# Patient Record
Sex: Female | Born: 1965 | Race: Black or African American | Hispanic: No | Marital: Single | Smoking: Never smoker
Health system: Southern US, Community
[De-identification: ages and names within clinical notes are randomized; demographics above are authoritative.]

## PROBLEM LIST (undated history)

## (undated) DIAGNOSIS — M1990 Unspecified osteoarthritis, unspecified site: Secondary | ICD-10-CM

## (undated) DIAGNOSIS — G8929 Other chronic pain: Secondary | ICD-10-CM

## (undated) DIAGNOSIS — I10 Essential (primary) hypertension: Secondary | ICD-10-CM

## (undated) DIAGNOSIS — C801 Malignant (primary) neoplasm, unspecified: Secondary | ICD-10-CM

## (undated) DIAGNOSIS — M79606 Pain in leg, unspecified: Secondary | ICD-10-CM

## (undated) HISTORY — DX: Unspecified osteoarthritis, unspecified site: M19.90

## (undated) HISTORY — PX: LYMPH NODE BIOPSY: SHX201

## (undated) HISTORY — PX: BREAST SURGERY: SHX581

## (undated) HISTORY — DX: Malignant (primary) neoplasm, unspecified: C80.1

---

## 1999-11-07 ENCOUNTER — Emergency Department (HOSPITAL_COMMUNITY): Admission: EM | Admit: 1999-11-07 | Discharge: 1999-11-07 | Payer: Self-pay | Admitting: Emergency Medicine

## 2000-03-11 ENCOUNTER — Inpatient Hospital Stay (HOSPITAL_COMMUNITY): Admission: AD | Admit: 2000-03-11 | Discharge: 2000-03-11 | Payer: Self-pay | Admitting: Obstetrics

## 2000-05-26 ENCOUNTER — Ambulatory Visit (HOSPITAL_COMMUNITY): Admission: RE | Admit: 2000-05-26 | Discharge: 2000-05-26 | Payer: Self-pay | Admitting: *Deleted

## 2000-05-26 ENCOUNTER — Encounter: Payer: Self-pay | Admitting: *Deleted

## 2000-09-22 ENCOUNTER — Inpatient Hospital Stay (HOSPITAL_COMMUNITY): Admission: AD | Admit: 2000-09-22 | Discharge: 2000-09-22 | Payer: Self-pay | Admitting: *Deleted

## 2000-09-23 ENCOUNTER — Inpatient Hospital Stay (HOSPITAL_COMMUNITY): Admission: AD | Admit: 2000-09-23 | Discharge: 2000-09-23 | Payer: Self-pay | Admitting: *Deleted

## 2000-10-21 ENCOUNTER — Encounter (HOSPITAL_COMMUNITY): Admission: RE | Admit: 2000-10-21 | Discharge: 2000-10-27 | Payer: Self-pay | Admitting: *Deleted

## 2000-10-25 ENCOUNTER — Inpatient Hospital Stay (HOSPITAL_COMMUNITY): Admission: AD | Admit: 2000-10-25 | Discharge: 2000-10-25 | Payer: Self-pay | Admitting: *Deleted

## 2000-10-26 ENCOUNTER — Encounter (INDEPENDENT_AMBULATORY_CARE_PROVIDER_SITE_OTHER): Payer: Self-pay | Admitting: Specialist

## 2000-10-26 ENCOUNTER — Inpatient Hospital Stay (HOSPITAL_COMMUNITY): Admission: AD | Admit: 2000-10-26 | Discharge: 2000-10-29 | Payer: Self-pay | Admitting: *Deleted

## 2001-02-25 ENCOUNTER — Emergency Department (HOSPITAL_COMMUNITY): Admission: EM | Admit: 2001-02-25 | Discharge: 2001-02-25 | Payer: Self-pay | Admitting: Emergency Medicine

## 2001-03-01 ENCOUNTER — Emergency Department (HOSPITAL_COMMUNITY): Admission: EM | Admit: 2001-03-01 | Discharge: 2001-03-01 | Payer: Self-pay

## 2001-04-08 ENCOUNTER — Emergency Department (HOSPITAL_COMMUNITY): Admission: EM | Admit: 2001-04-08 | Discharge: 2001-04-08 | Payer: Self-pay | Admitting: Emergency Medicine

## 2003-05-20 ENCOUNTER — Emergency Department (HOSPITAL_COMMUNITY): Admission: EM | Admit: 2003-05-20 | Discharge: 2003-05-20 | Payer: Self-pay | Admitting: Emergency Medicine

## 2003-09-22 ENCOUNTER — Emergency Department (HOSPITAL_COMMUNITY): Admission: AD | Admit: 2003-09-22 | Discharge: 2003-09-22 | Payer: Self-pay | Admitting: Family Medicine

## 2004-06-02 ENCOUNTER — Encounter: Admission: RE | Admit: 2004-06-02 | Discharge: 2004-06-02 | Payer: Self-pay | Admitting: Occupational Medicine

## 2004-07-15 ENCOUNTER — Emergency Department (HOSPITAL_COMMUNITY): Admission: EM | Admit: 2004-07-15 | Discharge: 2004-07-15 | Payer: Self-pay | Admitting: Emergency Medicine

## 2005-02-20 ENCOUNTER — Emergency Department (HOSPITAL_COMMUNITY): Admission: EM | Admit: 2005-02-20 | Discharge: 2005-02-20 | Payer: Self-pay | Admitting: Emergency Medicine

## 2005-03-01 ENCOUNTER — Ambulatory Visit: Payer: Self-pay | Admitting: *Deleted

## 2005-03-01 ENCOUNTER — Ambulatory Visit: Payer: Self-pay | Admitting: Family Medicine

## 2005-03-16 ENCOUNTER — Ambulatory Visit: Payer: Self-pay | Admitting: Family Medicine

## 2005-07-12 ENCOUNTER — Ambulatory Visit: Payer: Self-pay | Admitting: Family Medicine

## 2005-11-10 ENCOUNTER — Ambulatory Visit: Payer: Self-pay | Admitting: Family Medicine

## 2006-02-03 ENCOUNTER — Ambulatory Visit: Payer: Self-pay | Admitting: Family Medicine

## 2006-02-04 ENCOUNTER — Ambulatory Visit (HOSPITAL_COMMUNITY): Admission: RE | Admit: 2006-02-04 | Discharge: 2006-02-04 | Payer: Self-pay | Admitting: Family Medicine

## 2006-04-08 ENCOUNTER — Ambulatory Visit: Payer: Self-pay | Admitting: Family Medicine

## 2006-05-03 ENCOUNTER — Ambulatory Visit: Payer: Self-pay | Admitting: Family Medicine

## 2007-02-04 DIAGNOSIS — E785 Hyperlipidemia, unspecified: Secondary | ICD-10-CM

## 2007-02-04 DIAGNOSIS — K219 Gastro-esophageal reflux disease without esophagitis: Secondary | ICD-10-CM

## 2007-02-04 DIAGNOSIS — E669 Obesity, unspecified: Secondary | ICD-10-CM

## 2007-02-04 DIAGNOSIS — M1990 Unspecified osteoarthritis, unspecified site: Secondary | ICD-10-CM | POA: Insufficient documentation

## 2007-02-04 DIAGNOSIS — I10 Essential (primary) hypertension: Secondary | ICD-10-CM

## 2007-04-07 ENCOUNTER — Ambulatory Visit: Payer: Self-pay | Admitting: Internal Medicine

## 2007-04-08 LAB — CONVERTED CEMR LAB
AST: 18 units/L
BUN: 17 mg/dL
Calcium: 9.7 mg/dL
HCT: 36.7 %
HDL: 54 mg/dL
LDL Cholesterol: 81 mg/dL
MCV: 83 fL
Platelets: 427 10*3/uL
Potassium: 4.3 meq/L
Sodium: 143 meq/L
Triglycerides: 60 mg/dL

## 2007-04-12 ENCOUNTER — Encounter (INDEPENDENT_AMBULATORY_CARE_PROVIDER_SITE_OTHER): Payer: Self-pay | Admitting: Family Medicine

## 2007-04-12 LAB — CONVERTED CEMR LAB
Iron: 27 ug/dL — ABNORMAL LOW (ref 42–145)
Saturation Ratios: 9 % — ABNORMAL LOW (ref 20–55)
Vitamin B-12: 396 pg/mL (ref 211–911)

## 2007-06-07 ENCOUNTER — Encounter (INDEPENDENT_AMBULATORY_CARE_PROVIDER_SITE_OTHER): Payer: Self-pay | Admitting: *Deleted

## 2007-06-20 ENCOUNTER — Telehealth (INDEPENDENT_AMBULATORY_CARE_PROVIDER_SITE_OTHER): Payer: Self-pay | Admitting: *Deleted

## 2007-06-22 ENCOUNTER — Ambulatory Visit: Payer: Self-pay | Admitting: Nurse Practitioner

## 2007-06-22 DIAGNOSIS — J309 Allergic rhinitis, unspecified: Secondary | ICD-10-CM | POA: Insufficient documentation

## 2007-06-22 DIAGNOSIS — D649 Anemia, unspecified: Secondary | ICD-10-CM | POA: Insufficient documentation

## 2007-06-22 LAB — CONVERTED CEMR LAB: LDL Goal: 160 mg/dL

## 2007-08-22 ENCOUNTER — Ambulatory Visit: Payer: Self-pay | Admitting: Family Medicine

## 2007-08-23 ENCOUNTER — Telehealth (INDEPENDENT_AMBULATORY_CARE_PROVIDER_SITE_OTHER): Payer: Self-pay | Admitting: Family Medicine

## 2007-08-23 ENCOUNTER — Ambulatory Visit (HOSPITAL_COMMUNITY): Admission: RE | Admit: 2007-08-23 | Discharge: 2007-08-23 | Payer: Self-pay | Admitting: Family Medicine

## 2007-08-23 ENCOUNTER — Ambulatory Visit: Payer: Self-pay | Admitting: Hematology & Oncology

## 2007-08-23 LAB — CONVERTED CEMR LAB
Albumin: 3.7 g/dL (ref 3.5–5.2)
BUN: 10 mg/dL (ref 6–23)
Basophils Absolute: 0 10*3/uL (ref 0.0–0.1)
Basophils Relative: 0 % (ref 0–1)
CO2: 23 meq/L (ref 19–32)
Calcium: 9.4 mg/dL (ref 8.4–10.5)
Creatinine, Ser: 0.67 mg/dL (ref 0.40–1.20)
Eosinophils Absolute: 0.3 10*3/uL (ref 0.2–0.7)
Eosinophils Relative: 2 % (ref 0–5)
Glucose, Bld: 101 mg/dL — ABNORMAL HIGH (ref 70–99)
Lymphs Abs: 1.3 10*3/uL (ref 0.7–4.0)
MCHC: 32.3 g/dL (ref 30.0–36.0)
Neutro Abs: 9.1 10*3/uL — ABNORMAL HIGH (ref 1.7–7.7)
Platelets: 498 10*3/uL — ABNORMAL HIGH (ref 150–400)
RBC: 4.21 M/uL (ref 3.87–5.11)
RDW: 15.5 % (ref 11.5–15.5)
Sodium: 141 meq/L (ref 135–145)
TSH: 0.978 microintl units/mL (ref 0.350–5.50)
Total Bilirubin: 0.3 mg/dL (ref 0.3–1.2)

## 2007-08-30 ENCOUNTER — Ambulatory Visit (HOSPITAL_COMMUNITY): Admission: RE | Admit: 2007-08-30 | Discharge: 2007-08-30 | Payer: Self-pay | Admitting: Family Medicine

## 2007-08-30 ENCOUNTER — Encounter (INDEPENDENT_AMBULATORY_CARE_PROVIDER_SITE_OTHER): Payer: Self-pay | Admitting: Family Medicine

## 2007-08-30 LAB — CBC WITH DIFFERENTIAL/PLATELET
Basophils Absolute: 0 10*3/uL (ref 0.0–0.1)
Eosinophils Absolute: 0.2 10*3/uL (ref 0.0–0.5)
HCT: 33.5 % — ABNORMAL LOW (ref 34.8–46.6)
HGB: 11.2 g/dL — ABNORMAL LOW (ref 11.6–15.9)
MCV: 81.3 fL (ref 81.0–101.0)
MONO%: 6.6 % (ref 0.0–13.0)
NEUT#: 6.6 10*3/uL — ABNORMAL HIGH (ref 1.5–6.5)
NEUT%: 76.4 % (ref 39.6–76.8)
RDW: 11.8 % (ref 11.3–14.5)

## 2007-09-01 LAB — COMPREHENSIVE METABOLIC PANEL
ALT: 10 U/L (ref 0–35)
Alkaline Phosphatase: 152 U/L — ABNORMAL HIGH (ref 39–117)
CO2: 23 mEq/L (ref 19–32)
Creatinine, Ser: 0.67 mg/dL (ref 0.40–1.20)
Total Bilirubin: 0.3 mg/dL (ref 0.3–1.2)

## 2007-09-01 LAB — PROTEIN ELECTROPHORESIS, SERUM
Alpha-1-Globulin: 7.8 % — ABNORMAL HIGH (ref 2.9–4.9)
Alpha-2-Globulin: 16.2 % — ABNORMAL HIGH (ref 7.1–11.8)
Gamma Globulin: 16 % (ref 11.1–18.8)
Total Protein, Serum Electrophoresis: 7.1 g/dL (ref 6.0–8.3)

## 2007-09-01 LAB — PROTHROMBIN TIME
INR: 1.2 (ref 0.0–1.5)
Prothrombin Time: 15.8 seconds — ABNORMAL HIGH (ref 11.6–15.2)

## 2007-09-01 LAB — LACTATE DEHYDROGENASE: LDH: 263 U/L — ABNORMAL HIGH (ref 94–250)

## 2007-09-18 ENCOUNTER — Ambulatory Visit (HOSPITAL_COMMUNITY): Admission: RE | Admit: 2007-09-18 | Discharge: 2007-09-18 | Payer: Self-pay | Admitting: General Surgery

## 2007-09-18 ENCOUNTER — Encounter (HOSPITAL_BASED_OUTPATIENT_CLINIC_OR_DEPARTMENT_OTHER): Payer: Self-pay | Admitting: General Surgery

## 2007-09-18 DIAGNOSIS — C8118 Nodular sclerosis classical Hodgkin lymphoma, lymph nodes of multiple sites: Secondary | ICD-10-CM | POA: Insufficient documentation

## 2007-09-27 ENCOUNTER — Encounter (INDEPENDENT_AMBULATORY_CARE_PROVIDER_SITE_OTHER): Payer: Self-pay | Admitting: Nurse Practitioner

## 2007-09-27 ENCOUNTER — Encounter (INDEPENDENT_AMBULATORY_CARE_PROVIDER_SITE_OTHER): Payer: Self-pay | Admitting: Family Medicine

## 2007-09-28 ENCOUNTER — Encounter (INDEPENDENT_AMBULATORY_CARE_PROVIDER_SITE_OTHER): Payer: Self-pay | Admitting: Nurse Practitioner

## 2007-09-29 ENCOUNTER — Ambulatory Visit (HOSPITAL_COMMUNITY): Admission: RE | Admit: 2007-09-29 | Discharge: 2007-09-29 | Payer: Self-pay | Admitting: Hematology & Oncology

## 2007-10-02 ENCOUNTER — Ambulatory Visit: Admission: RE | Admit: 2007-10-02 | Discharge: 2007-10-02 | Payer: Self-pay | Admitting: Hematology & Oncology

## 2007-10-03 ENCOUNTER — Ambulatory Visit: Payer: Self-pay | Admitting: Psychiatry

## 2007-10-04 ENCOUNTER — Ambulatory Visit (HOSPITAL_COMMUNITY): Admission: RE | Admit: 2007-10-04 | Discharge: 2007-10-04 | Payer: Self-pay | Admitting: Hematology & Oncology

## 2007-10-04 ENCOUNTER — Encounter: Payer: Self-pay | Admitting: Hematology & Oncology

## 2007-10-04 ENCOUNTER — Ambulatory Visit: Payer: Self-pay | Admitting: Hematology & Oncology

## 2007-10-16 ENCOUNTER — Ambulatory Visit: Payer: Self-pay | Admitting: Hematology & Oncology

## 2007-10-18 ENCOUNTER — Encounter (INDEPENDENT_AMBULATORY_CARE_PROVIDER_SITE_OTHER): Payer: Self-pay | Admitting: Family Medicine

## 2007-10-18 LAB — COMPREHENSIVE METABOLIC PANEL
ALT: 9 U/L (ref 0–35)
BUN: 15 mg/dL (ref 6–23)
CO2: 24 mEq/L (ref 19–32)
Calcium: 8.9 mg/dL (ref 8.4–10.5)
Chloride: 103 mEq/L (ref 96–112)
Creatinine, Ser: 0.69 mg/dL (ref 0.40–1.20)
Total Bilirubin: 0.3 mg/dL (ref 0.3–1.2)

## 2007-10-18 LAB — CBC WITH DIFFERENTIAL/PLATELET
BASO%: 1.2 % (ref 0.0–2.0)
Basophils Absolute: 0.1 10*3/uL (ref 0.0–0.1)
HCT: 34.9 % (ref 34.8–46.6)
HGB: 12.1 g/dL (ref 11.6–15.9)
LYMPH%: 25.3 % (ref 14.0–48.0)
MCH: 27.5 pg (ref 26.0–34.0)
MCHC: 34.6 g/dL (ref 32.0–36.0)
MONO#: 0.2 10*3/uL (ref 0.1–0.9)
NEUT%: 65.6 % (ref 39.6–76.8)
Platelets: 291 10*3/uL (ref 145–400)

## 2007-10-30 ENCOUNTER — Encounter (INDEPENDENT_AMBULATORY_CARE_PROVIDER_SITE_OTHER): Payer: Self-pay | Admitting: Family Medicine

## 2007-10-30 LAB — COMPREHENSIVE METABOLIC PANEL
ALT: 8 U/L (ref 0–35)
CO2: 23 mEq/L (ref 19–32)
Chloride: 107 mEq/L (ref 96–112)
Sodium: 142 mEq/L (ref 135–145)
Total Bilirubin: 0.2 mg/dL — ABNORMAL LOW (ref 0.3–1.2)
Total Protein: 6.9 g/dL (ref 6.0–8.3)

## 2007-10-30 LAB — CBC WITH DIFFERENTIAL/PLATELET
BASO%: 1.4 % (ref 0.0–2.0)
EOS%: 2.2 % (ref 0.0–7.0)
LYMPH%: 15.6 % (ref 14.0–48.0)
MCHC: 33.3 g/dL (ref 32.0–36.0)
MONO#: 1.5 10*3/uL — ABNORMAL HIGH (ref 0.1–0.9)
MONO%: 6.5 % (ref 0.0–13.0)
Platelets: 348 10*3/uL (ref 145–400)
RBC: 4.12 10*6/uL (ref 3.70–5.32)
WBC: 23.8 10*3/uL — ABNORMAL HIGH (ref 3.9–10.0)

## 2007-11-13 ENCOUNTER — Encounter (INDEPENDENT_AMBULATORY_CARE_PROVIDER_SITE_OTHER): Payer: Self-pay | Admitting: Family Medicine

## 2007-11-13 LAB — COMPREHENSIVE METABOLIC PANEL
Albumin: 3.8 g/dL (ref 3.5–5.2)
Alkaline Phosphatase: 114 U/L (ref 39–117)
BUN: 18 mg/dL (ref 6–23)
Creatinine, Ser: 0.78 mg/dL (ref 0.40–1.20)
Glucose, Bld: 87 mg/dL (ref 70–99)
Total Bilirubin: 0.2 mg/dL — ABNORMAL LOW (ref 0.3–1.2)

## 2007-11-13 LAB — CBC WITH DIFFERENTIAL/PLATELET
Basophils Absolute: 0.1 10*3/uL (ref 0.0–0.1)
EOS%: 3.5 % (ref 0.0–7.0)
Eosinophils Absolute: 0.5 10*3/uL (ref 0.0–0.5)
HCT: 34.4 % — ABNORMAL LOW (ref 34.8–46.6)
LYMPH%: 21 % (ref 14.0–48.0)
MCHC: 34.3 g/dL (ref 32.0–36.0)
MONO#: 1.1 10*3/uL — ABNORMAL HIGH (ref 0.1–0.9)
MONO%: 7.1 % (ref 0.0–13.0)
NEUT%: 67.4 % (ref 39.6–76.8)
RBC: 4.2 10*6/uL (ref 3.70–5.32)
RDW: 16.4 % — ABNORMAL HIGH (ref 11.3–14.5)
WBC: 14.8 10*3/uL — ABNORMAL HIGH (ref 3.9–10.0)
lymph#: 3.1 10*3/uL (ref 0.9–3.3)

## 2007-11-17 ENCOUNTER — Encounter (INDEPENDENT_AMBULATORY_CARE_PROVIDER_SITE_OTHER): Payer: Self-pay | Admitting: Family Medicine

## 2007-11-21 ENCOUNTER — Ambulatory Visit (HOSPITAL_COMMUNITY): Admission: RE | Admit: 2007-11-21 | Discharge: 2007-11-21 | Payer: Self-pay | Admitting: Hematology & Oncology

## 2007-11-27 ENCOUNTER — Encounter (INDEPENDENT_AMBULATORY_CARE_PROVIDER_SITE_OTHER): Payer: Self-pay | Admitting: Family Medicine

## 2007-11-27 LAB — CBC WITH DIFFERENTIAL/PLATELET
BASO%: 0.9 % (ref 0.0–2.0)
HCT: 31.3 % — ABNORMAL LOW (ref 34.8–46.6)
MCHC: 35.8 g/dL (ref 32.0–36.0)
MONO#: 0.8 10*3/uL (ref 0.1–0.9)
NEUT#: 8.2 10*3/uL — ABNORMAL HIGH (ref 1.5–6.5)
NEUT%: 65.7 % (ref 39.6–76.8)
RBC: 3.83 10*6/uL (ref 3.70–5.32)
WBC: 12.5 10*3/uL — ABNORMAL HIGH (ref 3.9–10.0)
lymph#: 2.6 10*3/uL (ref 0.9–3.3)

## 2007-11-27 LAB — COMPREHENSIVE METABOLIC PANEL
ALT: <8 U/L (ref 0–35)
AST: 10 U/L (ref 0–37)
Albumin: 3.8 g/dL (ref 3.5–5.2)
CO2: 22 mEq/L (ref 19–32)
Calcium: 8.9 mg/dL (ref 8.4–10.5)
Chloride: 110 mEq/L (ref 96–112)
Potassium: 4 mEq/L (ref 3.5–5.3)
Total Protein: 6.2 g/dL (ref 6.0–8.3)

## 2007-12-07 ENCOUNTER — Ambulatory Visit: Payer: Self-pay | Admitting: Hematology & Oncology

## 2007-12-11 ENCOUNTER — Encounter (INDEPENDENT_AMBULATORY_CARE_PROVIDER_SITE_OTHER): Payer: Self-pay | Admitting: Family Medicine

## 2007-12-11 LAB — COMPREHENSIVE METABOLIC PANEL
AST: 8 U/L (ref 0–37)
Albumin: 4 g/dL (ref 3.5–5.2)
Alkaline Phosphatase: 98 U/L (ref 39–117)
BUN: 16 mg/dL (ref 6–23)
Potassium: 4 mEq/L (ref 3.5–5.3)

## 2007-12-11 LAB — CBC WITH DIFFERENTIAL/PLATELET
BASO%: 1 % (ref 0.0–2.0)
Basophils Absolute: 0.1 10*3/uL (ref 0.0–0.1)
EOS%: 7.3 % — ABNORMAL HIGH (ref 0.0–7.0)
HGB: 11.5 g/dL — ABNORMAL LOW (ref 11.6–15.9)
MCH: 27.9 pg (ref 26.0–34.0)
MCHC: 33.5 g/dL (ref 32.0–36.0)
MCV: 83.4 fL (ref 81.0–101.0)
MONO%: 6.5 % (ref 0.0–13.0)
RBC: 4.11 10*6/uL (ref 3.70–5.32)
RDW: 17.9 % — ABNORMAL HIGH (ref 11.3–14.5)

## 2007-12-25 ENCOUNTER — Encounter (INDEPENDENT_AMBULATORY_CARE_PROVIDER_SITE_OTHER): Payer: Self-pay | Admitting: Family Medicine

## 2007-12-25 LAB — CBC WITH DIFFERENTIAL/PLATELET
Basophils Absolute: 0.1 10*3/uL (ref 0.0–0.1)
Eosinophils Absolute: 0.8 10*3/uL — ABNORMAL HIGH (ref 0.0–0.5)
HGB: 11.2 g/dL — ABNORMAL LOW (ref 11.6–15.9)
MONO#: 0.7 10*3/uL (ref 0.1–0.9)
NEUT#: 4.7 10*3/uL (ref 1.5–6.5)
RBC: 3.98 10*6/uL (ref 3.70–5.32)
RDW: 17.8 % — ABNORMAL HIGH (ref 11.3–14.5)
WBC: 8.3 10*3/uL (ref 3.9–10.0)

## 2008-01-08 ENCOUNTER — Encounter (INDEPENDENT_AMBULATORY_CARE_PROVIDER_SITE_OTHER): Payer: Self-pay | Admitting: Family Medicine

## 2008-01-08 LAB — COMPREHENSIVE METABOLIC PANEL
Albumin: 3.8 g/dL (ref 3.5–5.2)
Alkaline Phosphatase: 93 U/L (ref 39–117)
BUN: 15 mg/dL (ref 6–23)
CO2: 21 mEq/L (ref 19–32)
Calcium: 8.8 mg/dL (ref 8.4–10.5)
Chloride: 110 mEq/L (ref 96–112)
Glucose, Bld: 98 mg/dL (ref 70–99)
Potassium: 3.7 mEq/L (ref 3.5–5.3)
Sodium: 142 mEq/L (ref 135–145)
Total Protein: 6.4 g/dL (ref 6.0–8.3)

## 2008-01-08 LAB — CBC WITH DIFFERENTIAL/PLATELET
BASO%: 0.9 % (ref 0.0–2.0)
LYMPH%: 14.6 % (ref 14.0–48.0)
MCHC: 33.9 g/dL (ref 32.0–36.0)
MONO#: 0.6 10*3/uL (ref 0.1–0.9)
Platelets: 351 10*3/uL (ref 145–400)
RBC: 3.98 10*6/uL (ref 3.70–5.32)
RDW: 17.5 % — ABNORMAL HIGH (ref 11.3–14.5)
WBC: 9.9 10*3/uL (ref 3.9–10.0)

## 2008-01-08 LAB — LACTATE DEHYDROGENASE: LDH: 228 U/L (ref 94–250)

## 2008-01-16 ENCOUNTER — Ambulatory Visit: Admission: RE | Admit: 2008-01-16 | Discharge: 2008-03-13 | Payer: Self-pay | Admitting: Radiation Oncology

## 2008-01-17 ENCOUNTER — Encounter (INDEPENDENT_AMBULATORY_CARE_PROVIDER_SITE_OTHER): Payer: Self-pay | Admitting: Family Medicine

## 2008-01-25 ENCOUNTER — Telehealth (INDEPENDENT_AMBULATORY_CARE_PROVIDER_SITE_OTHER): Payer: Self-pay | Admitting: Family Medicine

## 2008-01-26 ENCOUNTER — Ambulatory Visit (HOSPITAL_COMMUNITY): Admission: RE | Admit: 2008-01-26 | Discharge: 2008-01-26 | Payer: Self-pay | Admitting: Hematology & Oncology

## 2008-02-08 ENCOUNTER — Encounter (INDEPENDENT_AMBULATORY_CARE_PROVIDER_SITE_OTHER): Payer: Self-pay | Admitting: Family Medicine

## 2008-02-15 ENCOUNTER — Ambulatory Visit: Payer: Self-pay | Admitting: Hematology & Oncology

## 2008-02-19 ENCOUNTER — Encounter (INDEPENDENT_AMBULATORY_CARE_PROVIDER_SITE_OTHER): Payer: Self-pay | Admitting: Family Medicine

## 2008-02-19 LAB — CBC WITH DIFFERENTIAL/PLATELET
Basophils Absolute: 0 10*3/uL (ref 0.0–0.1)
EOS%: 19.1 % — ABNORMAL HIGH (ref 0.0–7.0)
Eosinophils Absolute: 0.8 10*3/uL — ABNORMAL HIGH (ref 0.0–0.5)
HCT: 34.3 % — ABNORMAL LOW (ref 34.8–46.6)
HGB: 11.6 g/dL (ref 11.6–15.9)
MCH: 28.2 pg (ref 26.0–34.0)
MCV: 83.5 fL (ref 81.0–101.0)
MONO%: 9.5 % (ref 0.0–13.0)
NEUT#: 2 10*3/uL (ref 1.5–6.5)
NEUT%: 49.1 % (ref 39.6–76.8)
RDW: 15.7 % — ABNORMAL HIGH (ref 11.3–14.5)

## 2008-02-19 LAB — COMPREHENSIVE METABOLIC PANEL
Albumin: 3.8 g/dL (ref 3.5–5.2)
Alkaline Phosphatase: 75 U/L (ref 39–117)
BUN: 14 mg/dL (ref 6–23)
Calcium: 9 mg/dL (ref 8.4–10.5)
Chloride: 111 mEq/L (ref 96–112)
Creatinine, Ser: 0.64 mg/dL (ref 0.40–1.20)
Glucose, Bld: 93 mg/dL (ref 70–99)
Potassium: 3.9 mEq/L (ref 3.5–5.3)

## 2008-03-27 ENCOUNTER — Ambulatory Visit: Payer: Self-pay | Admitting: Hematology & Oncology

## 2008-04-10 ENCOUNTER — Encounter (INDEPENDENT_AMBULATORY_CARE_PROVIDER_SITE_OTHER): Payer: Self-pay | Admitting: Family Medicine

## 2008-04-15 ENCOUNTER — Ambulatory Visit (HOSPITAL_COMMUNITY): Admission: RE | Admit: 2008-04-15 | Discharge: 2008-04-15 | Payer: Self-pay | Admitting: Hematology & Oncology

## 2008-04-16 ENCOUNTER — Ambulatory Visit: Payer: Self-pay | Admitting: Hematology & Oncology

## 2008-04-22 ENCOUNTER — Encounter (INDEPENDENT_AMBULATORY_CARE_PROVIDER_SITE_OTHER): Payer: Self-pay | Admitting: Nurse Practitioner

## 2008-04-22 LAB — COMPREHENSIVE METABOLIC PANEL
ALT: 10 U/L (ref 0–35)
AST: 11 U/L (ref 0–37)
Alkaline Phosphatase: 90 U/L (ref 39–117)
BUN: 17 mg/dL (ref 6–23)
Calcium: 9.2 mg/dL (ref 8.4–10.5)
Creatinine, Ser: 0.66 mg/dL (ref 0.40–1.20)
Total Bilirubin: 0.4 mg/dL (ref 0.3–1.2)

## 2008-04-22 LAB — CBC WITH DIFFERENTIAL (CANCER CENTER ONLY)
BASO%: 0.4 % (ref 0.0–2.0)
EOS%: 4.7 % (ref 0.0–7.0)
LYMPH#: 1.4 10*3/uL (ref 0.9–3.3)
MCH: 28 pg (ref 26.0–34.0)
MCHC: 34.3 g/dL (ref 32.0–36.0)
MONO%: 5.7 % (ref 0.0–13.0)
NEUT#: 3 10*3/uL (ref 1.5–6.5)
Platelets: 303 10*3/uL (ref 145–400)
RBC: 4.67 10*6/uL (ref 3.70–5.32)

## 2008-06-28 ENCOUNTER — Ambulatory Visit: Payer: Self-pay | Admitting: Hematology & Oncology

## 2008-07-16 ENCOUNTER — Ambulatory Visit (HOSPITAL_COMMUNITY): Admission: RE | Admit: 2008-07-16 | Discharge: 2008-07-16 | Payer: Self-pay | Admitting: Hematology & Oncology

## 2008-07-24 ENCOUNTER — Ambulatory Visit: Payer: Self-pay | Admitting: Hematology & Oncology

## 2008-07-25 ENCOUNTER — Encounter (INDEPENDENT_AMBULATORY_CARE_PROVIDER_SITE_OTHER): Payer: Self-pay | Admitting: Family Medicine

## 2008-07-25 LAB — CBC WITH DIFFERENTIAL (CANCER CENTER ONLY)
BASO#: 0 10*3/uL (ref 0.0–0.2)
BASO%: 0.4 % (ref 0.0–2.0)
EOS%: 5.7 % (ref 0.0–7.0)
HGB: 12.5 g/dL (ref 11.6–15.9)
MCH: 29.1 pg (ref 26.0–34.0)
MCHC: 34.6 g/dL (ref 32.0–36.0)
MONO%: 4.9 % (ref 0.0–13.0)
NEUT#: 2.1 10*3/uL (ref 1.5–6.5)
NEUT%: 52.7 % (ref 39.6–80.0)
RDW: 13.4 % (ref 10.5–14.6)

## 2008-07-25 LAB — COMPREHENSIVE METABOLIC PANEL
ALT: 10 U/L (ref 0–35)
AST: 11 U/L (ref 0–37)
Albumin: 3.8 g/dL (ref 3.5–5.2)
Alkaline Phosphatase: 83 U/L (ref 39–117)
BUN: 12 mg/dL (ref 6–23)
Calcium: 9.1 mg/dL (ref 8.4–10.5)
Chloride: 107 mEq/L (ref 96–112)
Potassium: 4.2 mEq/L (ref 3.5–5.3)
Sodium: 140 mEq/L (ref 135–145)
Total Protein: 6.4 g/dL (ref 6.0–8.3)

## 2008-08-20 ENCOUNTER — Ambulatory Visit: Payer: Self-pay | Admitting: Hematology & Oncology

## 2008-10-01 ENCOUNTER — Ambulatory Visit (HOSPITAL_COMMUNITY): Admission: RE | Admit: 2008-10-01 | Discharge: 2008-10-01 | Payer: Self-pay | Admitting: Hematology & Oncology

## 2008-10-09 ENCOUNTER — Ambulatory Visit: Payer: Self-pay | Admitting: Hematology & Oncology

## 2008-10-10 ENCOUNTER — Encounter (INDEPENDENT_AMBULATORY_CARE_PROVIDER_SITE_OTHER): Payer: Self-pay | Admitting: Family Medicine

## 2008-10-10 LAB — COMPREHENSIVE METABOLIC PANEL
AST: 8 U/L (ref 0–37)
Albumin: 3.9 g/dL (ref 3.5–5.2)
BUN: 14 mg/dL (ref 6–23)
Calcium: 9.1 mg/dL (ref 8.4–10.5)
Chloride: 109 mEq/L (ref 96–112)
Creatinine, Ser: 0.68 mg/dL (ref 0.40–1.20)
Glucose, Bld: 86 mg/dL (ref 70–99)

## 2008-11-21 ENCOUNTER — Ambulatory Visit: Payer: Self-pay | Admitting: Hematology & Oncology

## 2008-12-24 ENCOUNTER — Ambulatory Visit (HOSPITAL_COMMUNITY): Admission: RE | Admit: 2008-12-24 | Discharge: 2008-12-24 | Payer: Self-pay | Admitting: Hematology & Oncology

## 2009-01-22 ENCOUNTER — Ambulatory Visit: Payer: Self-pay | Admitting: Hematology & Oncology

## 2009-01-23 LAB — CBC WITH DIFFERENTIAL (CANCER CENTER ONLY)
BASO#: 0 10*3/uL (ref 0.0–0.2)
BASO%: 0.4 % (ref 0.0–2.0)
EOS%: 3 % (ref 0.0–7.0)
HCT: 37.1 % (ref 34.8–46.6)
HGB: 12.8 g/dL (ref 11.6–15.9)
LYMPH#: 1.3 10*3/uL (ref 0.9–3.3)
LYMPH%: 31.8 % (ref 14.0–48.0)
MCH: 29.4 pg (ref 26.0–34.0)
MCHC: 34.5 g/dL (ref 32.0–36.0)
MCV: 85 fL (ref 81–101)
NEUT%: 61 % (ref 39.6–80.0)
RDW: 12.6 % (ref 10.5–14.6)

## 2009-01-23 LAB — COMPREHENSIVE METABOLIC PANEL
ALT: 12 U/L (ref 0–35)
AST: 11 U/L (ref 0–37)
Albumin: 4 g/dL (ref 3.5–5.2)
Alkaline Phosphatase: 75 U/L (ref 39–117)
Calcium: 9.5 mg/dL (ref 8.4–10.5)
Chloride: 107 mEq/L (ref 96–112)
Creatinine, Ser: 0.65 mg/dL (ref 0.40–1.20)
Potassium: 4.9 mEq/L (ref 3.5–5.3)

## 2009-01-29 ENCOUNTER — Ambulatory Visit (HOSPITAL_COMMUNITY): Admission: RE | Admit: 2009-01-29 | Discharge: 2009-01-29 | Payer: Self-pay | Admitting: Hematology & Oncology

## 2009-03-17 IMAGING — NM NM CARDIA MUGA REST
6 series · 36 of 36 positions shown · non-contrast
Comparison: none

CLINICAL DATA: Hodgkin?s lymphoma.  Prechemotherapy.  
NUCLEAR MEDICINE CARDIAC BLOOD POOL IMAGING (MUGA):
TECHNIQUE: Cardiac multi-gated acquisition was performed at rest following intravenous injection of 0c-44m labeled red blood cells.   
Radiopharmaceutical:  23 mCi 0c-44m in-vitro labeled red blood cells.

[Series 1: mu muga · 4.34mm/px · 6 of 16 frames shown (1 of 6)]
[frame 2/16  full-range]
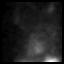
[frame 4/16  full-range]
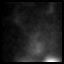
[frame 7/16  full-range]
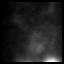
[frame 10/16  full-range]
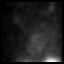
[frame 12/16  full-range]
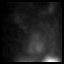
[frame 15/16  full-range]
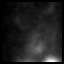

[Series 1: mu muga · 4.34mm/px · 6 of 16 frames shown (2 of 6)]
[frame 2/16  full-range]
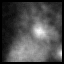
[frame 4/16  full-range]
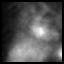
[frame 7/16  full-range]
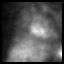
[frame 10/16  full-range]
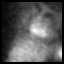
[frame 12/16  full-range]
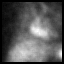
[frame 15/16  full-range]
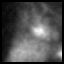

[Series 1: mu muga · 4.34mm/px · 6 of 16 frames shown (3 of 6)]
[frame 2/16  full-range]
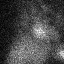
[frame 4/16  full-range]
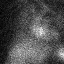
[frame 7/16  full-range]
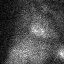
[frame 10/16  full-range]
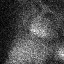
[frame 12/16  full-range]
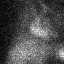
[frame 15/16  full-range]
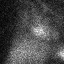

[Series 1: mu muga · 4.34mm/px · 6 of 16 frames shown (4 of 6)]
[frame 2/16  full-range]
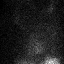
[frame 4/16  full-range]
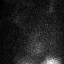
[frame 7/16  full-range]
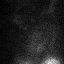
[frame 10/16  full-range]
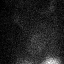
[frame 12/16  full-range]
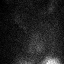
[frame 15/16  full-range]
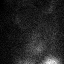

[Series 1: mu muga · 4.34mm/px · 6 of 16 frames shown (5 of 6)]
[frame 2/16  full-range]
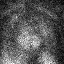
[frame 4/16  full-range]
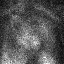
[frame 7/16  full-range]
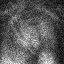
[frame 10/16  full-range]
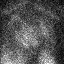
[frame 12/16  full-range]
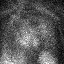
[frame 15/16  full-range]
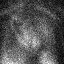

[Series 1: mu muga · 4.34mm/px · 6 of 16 frames shown (6 of 6)]
[frame 2/16  full-range]
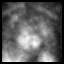
[frame 4/16  full-range]
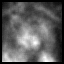
[frame 7/16  full-range]
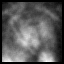
[frame 10/16  full-range]
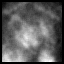
[frame 12/16  full-range]
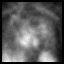
[frame 15/16  full-range]
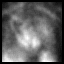

[36 of 36 positions shown; findings below may reference images not displayed]

FINDINGS: The present examination is somewhat limited with respect to evaluating cardiac motion.  What can be stated is that the calculated ejection fraction is 60%.  There is no gross left ventricular wall motion abnormality.
IMPRESSION: Slightly limited exam without gross wall motion abnormality and with left ventricular calculated ejection fraction of 60%.

## 2009-05-29 ENCOUNTER — Ambulatory Visit (HOSPITAL_COMMUNITY): Admission: RE | Admit: 2009-05-29 | Discharge: 2009-05-29 | Payer: Self-pay | Admitting: Hematology & Oncology

## 2009-06-03 ENCOUNTER — Ambulatory Visit: Payer: Self-pay | Admitting: Hematology & Oncology

## 2009-06-05 LAB — CBC WITH DIFFERENTIAL (CANCER CENTER ONLY)
BASO#: 0 10*3/uL (ref 0.0–0.2)
BASO%: 0.4 % (ref 0.0–2.0)
Eosinophils Absolute: 0.2 10*3/uL (ref 0.0–0.5)
HCT: 38.6 % (ref 34.8–46.6)
HGB: 13.2 g/dL (ref 11.6–15.9)
LYMPH#: 1.7 10*3/uL (ref 0.9–3.3)
LYMPH%: 25.9 % (ref 14.0–48.0)
MCV: 87 fL (ref 81–101)
MONO#: 0.3 10*3/uL (ref 0.1–0.9)
NEUT%: 66.2 % (ref 39.6–80.0)
RBC: 4.43 10*6/uL (ref 3.70–5.32)
RDW: 12.5 % (ref 10.5–14.6)
WBC: 6.4 10*3/uL (ref 3.9–10.0)

## 2009-06-06 LAB — COMPREHENSIVE METABOLIC PANEL
CO2: 22 mEq/L (ref 19–32)
Calcium: 9.6 mg/dL (ref 8.4–10.5)
Creatinine, Ser: 0.73 mg/dL (ref 0.40–1.20)
Glucose, Bld: 74 mg/dL (ref 70–99)
Total Bilirubin: 0.3 mg/dL (ref 0.3–1.2)

## 2009-06-06 LAB — VITAMIN D 25 HYDROXY (VIT D DEFICIENCY, FRACTURES): Vit D, 25-Hydroxy: 11 ng/mL — ABNORMAL LOW (ref 30–89)

## 2009-06-06 LAB — LACTATE DEHYDROGENASE: LDH: 130 U/L (ref 94–250)

## 2009-07-24 ENCOUNTER — Ambulatory Visit (HOSPITAL_COMMUNITY): Admission: RE | Admit: 2009-07-24 | Discharge: 2009-07-24 | Payer: Self-pay | Admitting: Hematology & Oncology

## 2009-08-06 ENCOUNTER — Encounter: Admission: RE | Admit: 2009-08-06 | Discharge: 2009-08-06 | Payer: Self-pay | Admitting: Hematology & Oncology

## 2009-08-07 ENCOUNTER — Ambulatory Visit: Payer: Self-pay | Admitting: Hematology & Oncology

## 2009-09-02 ENCOUNTER — Encounter: Admission: RE | Admit: 2009-09-02 | Discharge: 2009-09-02 | Payer: Self-pay | Admitting: Surgery

## 2009-09-04 ENCOUNTER — Encounter: Admission: RE | Admit: 2009-09-04 | Discharge: 2009-09-04 | Payer: Self-pay | Admitting: Surgery

## 2009-09-04 ENCOUNTER — Encounter (INDEPENDENT_AMBULATORY_CARE_PROVIDER_SITE_OTHER): Payer: Self-pay | Admitting: Internal Medicine

## 2009-09-04 ENCOUNTER — Ambulatory Visit (HOSPITAL_BASED_OUTPATIENT_CLINIC_OR_DEPARTMENT_OTHER): Admission: RE | Admit: 2009-09-04 | Discharge: 2009-09-04 | Payer: Self-pay | Admitting: Surgery

## 2009-09-04 ENCOUNTER — Encounter: Payer: Self-pay | Admitting: Physician Assistant

## 2009-09-05 ENCOUNTER — Telehealth: Payer: Self-pay | Admitting: Physician Assistant

## 2009-09-15 ENCOUNTER — Encounter: Payer: Self-pay | Admitting: Physician Assistant

## 2009-09-24 ENCOUNTER — Ambulatory Visit (HOSPITAL_COMMUNITY): Admission: RE | Admit: 2009-09-24 | Discharge: 2009-09-24 | Payer: Self-pay | Admitting: Hematology & Oncology

## 2009-09-30 ENCOUNTER — Ambulatory Visit: Payer: Self-pay | Admitting: Hematology & Oncology

## 2009-10-09 LAB — CBC WITH DIFFERENTIAL (CANCER CENTER ONLY)
BASO#: 0 10*3/uL (ref 0.0–0.2)
Eosinophils Absolute: 0.2 10*3/uL (ref 0.0–0.5)
HCT: 38.7 % (ref 34.8–46.6)
MCHC: 34.1 g/dL (ref 32.0–36.0)
MONO%: 2.9 % (ref 0.0–13.0)
Platelets: 389 10*3/uL (ref 145–400)
RDW: 11.9 % (ref 10.5–14.6)
WBC: 5.3 10*3/uL (ref 3.9–10.0)

## 2009-10-09 LAB — COMPREHENSIVE METABOLIC PANEL
AST: 9 U/L (ref 0–37)
Albumin: 4.3 g/dL (ref 3.5–5.2)
BUN: 18 mg/dL (ref 6–23)
Calcium: 9.9 mg/dL (ref 8.4–10.5)
Chloride: 104 mEq/L (ref 96–112)
Glucose, Bld: 99 mg/dL (ref 70–99)
Potassium: 4.4 mEq/L (ref 3.5–5.3)

## 2009-11-19 ENCOUNTER — Ambulatory Visit: Payer: Self-pay | Admitting: Internal Medicine

## 2009-11-19 LAB — CONVERTED CEMR LAB
AST: 10 units/L (ref 0–37)
Albumin: 4.2 g/dL (ref 3.5–5.2)
Alkaline Phosphatase: 73 units/L (ref 39–117)
Basophils Absolute: 0 10*3/uL (ref 0.0–0.1)
Chloride: 107 meq/L (ref 96–112)
Creatinine, Ser: 0.67 mg/dL (ref 0.40–1.20)
Eosinophils Relative: 3 % (ref 0–5)
HDL: 49 mg/dL (ref >39)
Hemoglobin: 13 g/dL (ref 12.0–15.0)
LDL Cholesterol: 97 mg/dL (ref 0–99)
Monocytes Relative: 7 % (ref 3–12)
Platelets: 286 10*3/uL (ref 150–400)
RBC: 4.46 M/uL (ref 3.87–5.11)
Sodium: 142 meq/L (ref 135–145)
Total CHOL/HDL Ratio: 3.3

## 2009-11-22 ENCOUNTER — Encounter (INDEPENDENT_AMBULATORY_CARE_PROVIDER_SITE_OTHER): Payer: Self-pay | Admitting: Internal Medicine

## 2010-01-01 ENCOUNTER — Ambulatory Visit: Payer: Self-pay | Admitting: Internal Medicine

## 2010-01-02 ENCOUNTER — Encounter (INDEPENDENT_AMBULATORY_CARE_PROVIDER_SITE_OTHER): Payer: Self-pay | Admitting: Internal Medicine

## 2010-01-02 LAB — CONVERTED CEMR LAB
BUN: 15 mg/dL (ref 6–23)
CO2: 22 meq/L (ref 19–32)
Glucose, Bld: 83 mg/dL (ref 70–99)

## 2010-01-03 ENCOUNTER — Encounter (INDEPENDENT_AMBULATORY_CARE_PROVIDER_SITE_OTHER): Payer: Self-pay | Admitting: Internal Medicine

## 2010-01-27 ENCOUNTER — Ambulatory Visit: Payer: Self-pay | Admitting: Physician Assistant

## 2010-01-27 DIAGNOSIS — J209 Acute bronchitis, unspecified: Secondary | ICD-10-CM

## 2010-02-12 ENCOUNTER — Ambulatory Visit (HOSPITAL_COMMUNITY): Admission: RE | Admit: 2010-02-12 | Discharge: 2010-02-12 | Payer: Self-pay | Admitting: Hematology & Oncology

## 2010-02-19 ENCOUNTER — Ambulatory Visit: Payer: Self-pay | Admitting: Hematology & Oncology

## 2010-02-27 LAB — CBC WITH DIFFERENTIAL (CANCER CENTER ONLY)
BASO%: 0.5 % (ref 0.0–2.0)
LYMPH%: 29.4 % (ref 14.0–48.0)
MCH: 29.4 pg (ref 26.0–34.0)
MCHC: 34.3 g/dL (ref 32.0–36.0)
MCV: 86 fL (ref 81–101)
MONO%: 3.4 % (ref 0.0–13.0)
Platelets: 297 10*3/uL (ref 145–400)
RDW: 12.1 % (ref 10.5–14.6)

## 2010-02-27 LAB — COMPREHENSIVE METABOLIC PANEL
ALT: <8 U/L (ref 0–35)
Alkaline Phosphatase: 74 U/L (ref 39–117)
Creatinine, Ser: 0.71 mg/dL (ref 0.40–1.20)
Sodium: 138 mEq/L (ref 135–145)
Total Bilirubin: 0.5 mg/dL (ref 0.3–1.2)
Total Protein: 6.6 g/dL (ref 6.0–8.3)

## 2010-07-04 ENCOUNTER — Emergency Department (HOSPITAL_COMMUNITY): Admission: EM | Admit: 2010-07-04 | Discharge: 2010-07-04 | Payer: Self-pay | Admitting: Emergency Medicine

## 2010-08-20 ENCOUNTER — Ambulatory Visit: Payer: Self-pay | Admitting: Hematology & Oncology

## 2010-10-11 ENCOUNTER — Encounter: Payer: Self-pay | Admitting: Hematology & Oncology

## 2010-10-20 NOTE — Letter (Signed)
Summary: Lipid Letter  [facility]  [address] [place], [place] [address]   Phone: [phone]  Fax: [phone]    11/22/2009  [facility] [address] Marlowe Palmer [place], [place]  [address]  Dear Morgan Palmer:  We have carefully reviewed your last lipid profile from 11/19/2009 and the results are noted below with a summary of recommendations for lipid management.    Cholesterol:       161     Goal: <200   HDL "good" Cholesterol:   49     Goal: >45   LDL "bad" Cholesterol:   97     Goal: <100   Triglycerides:       73     Goal: <150    Cholesterol as well as blood counts, liver, kidney function are all okay.    TLC Diet (Therapeutic Lifestyle Change): Saturated Fats & Transfatty acids should be kept < 7% of total calories ***Reduce Saturated Fats Polyunstaurated Fat can be up to 10% of total calories Monounsaturated Fat Fat can be up to 20% of total calories Total Fat should be no greater than 25-35% of total calories Carbohydrates should be 50-60% of total calories Protein should be approximately 15% of total calories Fiber should be at least 20-30 grams a day ***Increased fiber may help lower LDL Total Cholesterol should be < 200mg /day Consider adding plant stanol/sterols to diet (example: Benacol spread) ***A higher intake of unsaturated fat may reduce Triglycerides and Increase HDL    Adjunctive Measures (may lower LIPIDS and reduce risk of Heart Attack) include: Aerobic Exercise (20-30 minutes 3-4 times a week) Limit Alcohol Consumption Weight Reduction Aspirin 75-81 mg a day by mouth (if not allergic or contraindicated) Dietary Fiber 20-30 grams a day by mouth     Current Medications: 1)    Protonix 40 Mg Tbec (Pantoprazole sodium) .Marland Kitchen.. 1 by mouth once daily 2)    Naprosyn 500 Mg Tabs (Naproxen) .Marland Kitchen.. 1 by mouth two times a day as needed pain 3)    Lotensin 20 Mg Tabs (Benazepril hcl) .Marland Kitchen.. 1 tab by mouth daily 4)    Allopurinol 100 Mg  Tabs  (Allopurinol) .Marland Kitchen.. 1 tablet by mouth daily  **rx by [facility]** 5)    Drisdol 20254 Unit Caps (Ergocalciferol) .... One cap by mouth one time each week  If you have any questions, please call. We appreciate being able to work with you.   Sincerely,    [facility] Beverley Fiedler MD

## 2010-10-20 NOTE — Assessment & Plan Note (Signed)
Summary: HAS HAD A COLD FOR A COUPLE OF DAYS/NEED DR NOTE//SS   Vital Signs:  Patient profile:   45 year old female Height:      66 inches Weight:      309 pounds BMI:     50.05 Temp:     96.6 degrees F Pulse rate:   90 / minute Pulse rhythm:   regular Resp:     18 per minute BP sitting:   110 / 78  (left arm) Cuff size:   large  Vitals Entered By: Armenia Shannon (Jan 27, 2010 9:37 AM) CC: chest cold,runny nose ,mucus(yellow),since last week, OTC med., nothing help,fever(hot/cold) Is Patient Diabetic? No Pain Assessment Patient in pain? no       Does patient need assistance? Functional Status Self care Ambulation Normal   Primary Care Provider:  Julieanne Manson MD  CC:  chest cold, runny nose , mucus(yellow), since last week, OTC med., nothing help, and fever(hot/cold).  History of Present Illness: Comes in today for cold symptoms.  Started 1 weeks ago.   + cough . . . productive (yellow in AM and clears through the day). + chills No fever. + sinus congestion No sore throat No otalgia No chest discomfort + dyspnea Taking mucinex and robitussin and some other over the counter cold meds.  Helps some when she uses it.   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 1 By Mouth Once Daily 2)  Naprosyn 500 Mg Tabs (Naproxen) .Marland Kitchen.. 1 By Mouth Two Times A Day As Needed Pain 3)  Lotensin 20 Mg Tabs (Benazepril Hcl) .Marland Kitchen.. 1 Tab By Mouth Daily 4)  Allopurinol 100 Mg  Tabs  (Allopurinol) .Marland Kitchen.. 1 Tablet By Mouth Daily  **rx By [facility]** 5)  Drisdol 42595 Unit Caps (Ergocalciferol) .... One Cap By Mouth One Time Each Week  Allergies (verified): No Known Drug Allergies  Past History:  Past Medical History: Last updated: 11/19/2009 FIBROCYSTIC BREAST DISEASE, LEFT (ICD-610.1) ENCOUNTER FOR LONG-TERM USE OF OTHER MEDICATIONS (ICD-V58.69) HODGKINS NODULAR SCLEROSIS NODES MULTIPLE SITES (ICD-201.58) ANEMIA (ICD-285.9) ALLERGIC RHINITIS (ICD-477.9) OBESITY NOS (ICD-278.00) OSTEOARTHRITIS (ICD-715.90) HYPERTENSION (ICD-401.9) HYPERLIPIDEMIA (ICD-272.4) GERD (ICD-530.81)  Physical Exam  General:  alert, well-developed, and well-nourished.   Head:  normocephalic and atraumatic.   Eyes:  pupils equal, pupils round, pupils reactive to light, and no injection.   Ears:  R ear normal and L ear normal.   Nose:  no external deformity.   Mouth:  pharynx pink and moist, no erythema, and no exudates.   Neck:  no cervical lymphadenopathy.   Lungs:  normal respiratory effort, no crackles, R wheezes, and L wheezes.   Heart:  normal rate and regular rhythm.   Neurologic:  alert & oriented X3 and cranial nerves II-XII intact.   Psych:  normally interactive.     Impression & Recommendations:  Problem # 1:  ACUTE BRONCHITIS (ICD-466.0)  Her updated medication list for this problem includes:    Tessalon Perles 100 Mg Caps (Benzonatate) .Marland Kitchen... Take 1 capsule  by mouth three times a day as needed for cough    Proventil Hfa 108 (90 Base) Mcg/act Aers (Albuterol sulfate) .Marland Kitchen... 1-2 puffs every 4-6 hours as needed  Complete Medication List: 1)  Protonix 40 Mg Tbec (Pantoprazole sodium) .Marland Kitchen.. 1 by mouth once daily 2)  Naprosyn 500 Mg Tabs (Naproxen) .Marland Kitchen.. 1 by mouth two times a day as needed pain 3)  Lotensin 20 Mg Tabs (Benazepril hcl) .Marland Kitchen.. 1 tab by mouth daily 4)  Allopurinol 100 Mg Tabs (Allopurinol) .Marland Kitchen.. 1 tablet by mouth daily  **rx by [facility]** 5)  Drisdol [address] Unit Caps (Ergocalciferol) .... One cap by mouth one time each week 6)  Tessalon Perles 100 Mg Caps (Benzonatate) .... Take 1 capsule by mouth three times a day as needed for cough 7)  Proventil Hfa 108 (90 Base) Mcg/act Aers (Albuterol sulfate) .Marland Kitchen.. 1-2 puffs every 4-6 hours as needed  Patient Instructions: 1)  Get plenty of rest, drink lots of clear liquids, and use Tylenol or Ibuprofen for fever and comfort. Return in 7-10 days if you're not better: sooner if you'er feeling worse.  2)  Take 650 - 1000 mg of tylenol every 4-6 hours as needed for relief of pain or comfort of fever. Avoid taking more than 4000 mg in a 24 hour period( can cause liver damage in higher doses).  3)  Use the Proventil as needed for cough or shortness of breath. 4)  You can either use Robitussin for cough or the Tessalon Perles I gave you. 5)  Use the Allegra once daily as needed for cough and drainage. Prescriptions: PROVENTIL HFA 108 (90 BASE) MCG/ACT AERS (ALBUTEROL SULFATE) 1-2 puffs every 4-6 hours as needed  #1 x 0   Entered and Authorized by:   Tereso Newcomer PA-C   Signed by:   Tereso Newcomer PA-C on 01/27/2010   Method used:   Samples Given   RxID:   [id number] TESSALON PERLES 100 MG CAPS (BENZONATATE) Take 1 capsule by mouth three times a day as needed for cough  #30 x 1   Entered and Authorized by:   Tereso Newcomer PA-C   Signed by:   Tereso Newcomer PA-C on 01/27/2010   Method used:   Print then Give to Patient   RxID:   [id number]

## 2010-10-20 NOTE — Assessment & Plan Note (Signed)
Summary: Morgan Palmer PT/RENEW MEDS/////KT   Vital Signs:  Patient profile:   45 year old female Height:      66 inches Weight:      309 pounds BMI:     50.05 Temp:     97.6 degrees F oral Pulse rate:   76 / minute Pulse rhythm:   regular Resp:     18 per minute BP sitting:   157 / 100  (left arm) Cuff size:   large  Vitals Entered By: Armenia Shannon (November 19, 2009 8:40 AM) CC: renew meds... Is Patient Diabetic? No Pain Assessment Patient in pain? yes     Location: knees Intensity: 8 Type: dull/throbbing Onset of pain  With activity  Does patient need assistance? Functional Status Self care Ambulation Normal   Primary Care Provider:  Rankins  CC:  renew meds....  History of Present Illness: 1.  Left breast calcifications:  underwent excisional breast biopsy on 09/04/09 with Dr. Luisa Hart.  Pathology showed fibrocystic breast disease.  No concerns.  2.  Hypertension:  Pt. states gained weight during treatment of Hodgkin's 2 years ago.  We discussed a continual weight gain, however.  Works out at [employer] 3 times weekly.  Had blood work done with Dr. Myna Hidalgo in January at cancer center.  Pt. states also may miss meds because if going out, does not take until later and then forgets--has to urinate with diuretic.  Ultimately misses at least 3 times weekly.  3.  Bilateral knee arthritis:  Needs refills of Naproxen--works well for her.    4.  Allergies:  Since treated for Hodgkin's, no longer with breathing difficulties and has not been taking Singulair orAllegra.  5.  Hyperlipidemia:  Was on Zetia, but removed when cholesterol improved in 2008.  6.  Health maintenance:  pt. initially concerned with getting flu illness if gets immunized.  Discussed and pt. willing to undergo immunization.  Current Medications (verified): 1)  Protonix 40 Mg Tbec (Pantoprazole Sodium) .Marland Kitchen.. 1 By Mouth Once Daily 2)  Naprosyn 500 Mg Tabs (Naproxen) .Marland Kitchen.. 1 By Mouth Two Times A Day As Needed Pain 3)   Lotensin Hct 10-12.5 Mg Tabs (Benazepril-Hydrochlorothiazide) .Marland Kitchen.. 1 Po Once Daily 4)  Loratadine 10 Mg  Tabs (Loratadine) .Marland Kitchen.. 1 Tablet By Mouth Daily For Allergies 5)  Singulair 10 Mg  Tabs (Montelukast Sodium) .Marland Kitchen.. 1 Tablet By Mouth Daily For Breathing 6)  Ferrous Sulfate 324 Mg  Tabs (Ferrous Sulfate) .Marland Kitchen.. 1 Tablet By Mouth Daily For Anemia 7)  Zofran 8 Mg  Tabs (Ondansetron Hcl) .Marland Kitchen.. 1 Tablet By Mouth Every 12 Hours X 3 Days **rx By [facility]** 8)  Promethazine Hcl 25 Mg  Tabs (Promethazine Hcl) .Marland Kitchen.. 1 Tablet By Mouth Every 6 Hours As Needed For Nausaa **rx By [facility]** 9)  Allopurinol 100 Mg  Tabs (Allopurinol) .Marland Kitchen.. 1 Tablet By Mouth Daily  **[facility]** 10)  Emla 2.5-2.5 %  Crea (Lidocaine-Prilocaine) .... Apply To Area Prior To Chemo **rx By [facility]** 11)  Drisdol 84696 Unit Caps (Ergocalciferol) .... One Cap By Mouth One Time Each Week  Allergies (verified): No Known Drug Allergies  Past History:  Past Medical History: FIBROCYSTIC BREAST DISEASE, LEFT (ICD-610.1) ENCOUNTER FOR LONG-TERM USE OF OTHER MEDICATIONS (ICD-V58.69) HODGKINS NODULAR SCLEROSIS NODES MULTIPLE SITES (ICD-201.58) ANEMIA (ICD-285.9) ALLERGIC RHINITIS (ICD-477.9) OBESITY NOS (ICD-278.00) OSTEOARTHRITIS (ICD-715.90) HYPERTENSION (ICD-401.9) HYPERLIPIDEMIA (ICD-272.4) GERD (ICD-530.81)  Physical Exam  General:  obese, NAD Lungs:  Normal respiratory  effort, chest expands symmetrically. Lungs are clear to auscultation, no crackles or wheezes. Heart:  Normal rate and regular rhythm. S1 and S2 normal without gallop, murmur, click, rub or other extra sounds.  Radial pulses normal and equal Extremities:  no edema   Impression & Recommendations:  Problem # 1:  OBESITY NOS (ICD-278.00)  Encouraged exercise and weight loss.  Orders: Nutrition Referral (Nutrition)  Problem # 2:  ALLERGIC RHINITIS (ICD-477.9) No longer an issue since treated for  Hodgkins per pt. The following medications were removed from the medication list:    Loratadine 10 Mg Tabs (Loratadine) .Marland Kitchen... 1 tablet by mouth daily for allergies    Promethazine Hcl 25 Mg Tabs (Promethazine hcl) .Marland Kitchen... 1 tablet by mouth every 6 hours as needed for nausaa **[facility]**  Problem # 3:  FIBROCYSTIC BREAST DISEASE, LEFT (ICD-610.1) Documented  Problem # 4:  OSTEOARTHRITIS (ICD-715.90) Encouraged weight loss for knee pain Her updated medication list for this problem includes:    Naprosyn 500 Mg Tabs (Naproxen) .Marland Kitchen... 1 by mouth two times a day as needed pain  Problem # 5:  HYPERTENSION (ICD-401.9) Switch to plain Lotensin to see if better tolerated and more compliant Her updated medication list for this problem includes:    Lotensin 20 Mg Tabs (Benazepril hcl) .Marland Kitchen... 1 tab by mouth daily  Orders: T-Comprehensive Metabolic Panel ([address]) Nutrition Referral (Nutrition)  Problem # 6:  HYPERLIPIDEMIA (ICD-272.4)  Orders: T-Lipid Profile ([address]-91478) Nutrition Referral (Nutrition)  Complete Medication List: 1)  Protonix 40 Mg Tbec (Pantoprazole sodium) .Marland Kitchen.. 1 by mouth once daily 2)  Naprosyn 500 Mg Tabs (Naproxen) .Marland Kitchen.. 1 by mouth two times a day as needed pain 3)  Lotensin 20 Mg Tabs (Benazepril hcl) .Marland Kitchen.. 1 tab by mouth daily 4)  Allopurinol 100 Mg Tabs (Allopurinol) .Marland Kitchen.. 1 tablet by mouth daily  **rx [facility]** 5)  Drisdol 29562 Unit Caps (Ergocalciferol) .... One cap by mouth one time each week  Other Orders: T-CBC w/Diff ([address]) Flu Vaccine 41yrs + ([address]) Admin 1st Vaccine ([address]) Admin 1st Vaccine [facility]) [phone])  Patient Instructions: 1)  Lab appt in 1 month for BMET ( v58.69) and BP check. 2)  CPP in 4 months--Dr. Delrae Alfred Prescriptions: LOTENSIN 20 MG TABS (BENAZEPRIL HCL) 1 tab by mouth daily  #30 x 11   Entered and Authorized by:   Julieanne Manson MD   Signed by:   Julieanne Manson MD on 11/19/2009    Method used:   Faxed to ...       [facility] (retail)       [address] [place], [place]  [address]       Ph: [phone]       Fax: [phone]   RxID:   [id number] NAPROSYN 500 MG TABS (NAPROXEN) 1 by mouth two times a day as needed pain  #60 x 6   Entered and Authorized by:   Julieanne Manson MD   Signed by:   Julieanne Manson MD on 11/19/2009   Method used:   Faxed to ...       [facility] (retail)       [address] [place], [place]  [address]       Ph: [phone]       Fax: [phone]   RxID:   [id number] PROTONIX 40 MG TBEC (PANTOPRAZOLE SODIUM) 1 by  mouth once daily  #30 x 11   Entered and Authorized by:   Julieanne Manson MD   Signed by:   Julieanne Manson MD on 11/19/2009   Method used:   Faxed to ...       [facility] (retail)       [address] [place], [place]  [address]       Ph: [phone]       Fax: [phone]   RxID:   [id number]    Influenza Vaccine    Vaccine Type: Fluvax 3+    Site: right deltoid    Mfr: Sanofi Pasteur    Dose: 0.5 ml    Route: IM    Given by: Armenia Shannon    Exp. Date: 03/19/2010    Lot #: [id number]    VIS given: 04/13/07 version given November 19, 2009.  Flu Vaccine Consent Questions    Do you have a history of severe allergic reactions to this vaccine? no    Any prior history of allergic reactions to egg and/or gelatin? no    Do you have a sensitivity to the preservative Thimersol? no    Do you have a past history of Guillan-Barre Syndrome? no    Do you currently have an acute febrile illness? no    Have you ever had a severe reaction to latex? no    Vaccine information given and explained to patient? yes    Are you currently pregnant? no

## 2010-10-20 NOTE — Letter (Signed)
Summary: ONC OV  ONC OV   Imported By: Arta Bruce 01/03/2008 15:34:08  _____________________________________________________________________  External Attachment:    Type:   Image     Comment:   External Document

## 2010-10-20 NOTE — Letter (Signed)
Summary: *HSN Results Follow up  [facility]  [address] [place], [place] [address]   Phone: [phone]  Fax: [phone]      01/03/2010   MICAELLA GITTO [address] [place], [place]  [address]   Dear  Ms. Robynn Arviso,                            ____S.Drinkard,FNP   ____D. Gore,FNP       ____B. McPherson,MD   ____V. Rankins,MD    __X__E. Dondrea Clendenin,MD    ____N. Daphine Deutscher, FNP  ____D. Reche Dixon, MD    ____K. Philipp Deputy, MD    ____Other     This letter is to inform you that your recent test(s):  _______Pap Smear    ____X___Lab Test     _______X-ray    ____X__ is within acceptable limits  _______ requires a medication change  _______ requires a follow-up lab visit  _______ requires a follow-up visit with your provider   Comments:  Your blood pressure was much better and your labs were fine.  Appears this way to control your blood pressure works better for you.       _________________________________________________________ If you have any questions, please contact our office                     Sincerely,  Julieanne Manson MD [facility]

## 2010-10-20 NOTE — Letter (Signed)
Summary: Out of Work  [facility]  [address] [place], [place] [address]   Phone: [phone]  Fax: [phone]    Jan 27, 2010   Employee:  MYRANDA PAVONE    To Whom It May Concern:   For Medical reasons, please excuse the above named employee from work for the following dates:  Start:   Jan 24, 2010  End:   Jan 30, 2010  If you need additional information, please feel free to contact our office.         Sincerely,    Tereso Newcomer PA-C

## 2010-10-23 NOTE — Op Note (Signed)
Summary: Operative Report  Operative Report   Imported By: Arta Bruce 11/04/2009 15:05:35  _____________________________________________________________________  External Attachment:    Type:   Image     Comment:   External Document

## 2010-12-02 LAB — POCT I-STAT, CHEM 8
BUN: 15 mg/dL (ref 6–23)
Calcium, Ion: 1.21 mmol/L (ref 1.12–1.32)
Chloride: 107 mEq/L (ref 96–112)
Creatinine, Ser: 0.7 mg/dL (ref 0.4–1.2)
Sodium: 143 mEq/L (ref 135–145)
TCO2: 25 mmol/L (ref 0–100)

## 2010-12-07 LAB — GLUCOSE, CAPILLARY: Glucose-Capillary: 99 mg/dL (ref 70–99)

## 2010-12-22 LAB — DIFFERENTIAL
Basophils Relative: 0 % (ref 0–1)
Eosinophils Absolute: 0.2 10*3/uL (ref 0.0–0.7)
Eosinophils Relative: 3 % (ref 0–5)
Monocytes Absolute: 0.3 10*3/uL (ref 0.1–1.0)
Monocytes Relative: 6 % (ref 3–12)

## 2010-12-22 LAB — COMPREHENSIVE METABOLIC PANEL
ALT: 14 U/L (ref 0–35)
AST: 16 U/L (ref 0–37)
Albumin: 3.6 g/dL (ref 3.5–5.2)
Alkaline Phosphatase: 81 U/L (ref 39–117)
Glucose, Bld: 71 mg/dL (ref 70–99)
Potassium: 4.4 mEq/L (ref 3.5–5.1)
Sodium: 141 mEq/L (ref 135–145)
Total Protein: 6.9 g/dL (ref 6.0–8.3)

## 2010-12-22 LAB — CBC
Hemoglobin: 13.3 g/dL (ref 12.0–15.0)
Platelets: 262 10*3/uL (ref 150–400)
RDW: 13.6 % (ref 11.5–15.5)

## 2011-02-02 NOTE — Op Note (Signed)
NAMESHARMA, Morgan Palmer               ACCOUNT NO.:  [id number]   MEDICAL RECORD NO.:  [id number]          PATIENT TYPE:  AMB   LOCATION:  SDS                          FACILITY:  [facility]   PHYSICIAN:  Leonie Man, M.D.   DATE OF BIRTH:  [birth year]   DATE OF PROCEDURE:  09/18/2007  DATE OF DISCHARGE:                               OPERATIVE REPORT   PREOPERATIVE DIAGNOSIS:  Diffuse cervical lymphadenopathy.   POSTOPERATIVE DIAGNOSIS:  Diffuse cervical lymphadenopathy, rule out  lymphoma.   INDICATIONS:  Morgan Palmer is a 44 year old female presenting originally  through [facility] for evaluation of persistent tachycardia.  At that  time on examination, was noted to have very large bilateral cervical  lymphadenopathy.  She was further evaluated by CT scan of the head and  neck and chest which showed extensive cervical and mediastinal  lymphadenopathy.  She has had a PET CT with significant FDG uptake  involving her neck and mediastinum.  The patient comes to the operating  room now for tissue diagnosis by excisional biopsy of a cervical lymph  node on the left side.  She understands the risks and potential benefits  and gives her consent.   DESCRIPTION OF PROCEDURE:  The patient was positioned supinely following  the induction of satisfactory general endotracheal anesthesia.  The neck  is prepped and draped to be included in the sterile operative field.  Time out and positive identification of the site and the patient was  carried out.  A transverse lithotomy incision was in the left  supraclavicular region, is deepened through the skin and subcutaneous  tissue, going across the platysma muscle and through the cervical  fascia.  A large cervical lymphadenopathy was palpated and dissected  free.  I removed two intact lymph nodes and these were forwarded for  pathologic evaluation. Hemostasis was obtained with clamps and ties of 3-  0 Vicryl and electrocautery.  Sponge and instrument  counts were  verified.  The cervical fascia was closed with interrupted 3-0 Vicryl  sutures.  The platysma muscle closed with interrupted 3-0 Vicryl  sutures.  Skin closed with running 5-0 Monocryl and reinforced with  Steri-Strips.  Sterile dressing applied.  Anesthetic reversed.  The  patient removed from the operating room to the recovery room in stable  condition.  She tolerated the procedure well.      Leonie Man, M.D.  Electronically Signed     PB/MEDQ  D:  09/18/2007  T:  09/18/2007  Job:  [id number]

## 2011-02-02 NOTE — Op Note (Signed)
NAMEROLANDO, WHITBY NO.:  [id number]   MEDICAL RECORD NO.:  [id number]          PATIENT TYPE:  OUT   LOCATION:  OMED                         FACILITY:  [facility]   PHYSICIAN:  Rose Phi. Myna Hidalgo, M.D. DATE OF BIRTH:  January 01, 1966   DATE OF PROCEDURE:  10/04/2007  DATE OF DISCHARGE:                               OPERATIVE REPORT   PROCEDURE:  Left posterior iliac crest bone marrow biopsy and aspirate.   PROCEDURE IN DETAIL:  Ms. Gilchrest was brought to the short-stay unit.  She had an IV placed without difficulty.  She received a total of 9 mg  of Versed and 50 mg of Demerol for IV sedation.  She was placed on to  her right side.  The left posterior crest region was prepped and draped  in sterile fashion.  Ten mL of 2% lidocaine was infiltrated under the  skin and down to the periosteum.  We had to use a 22 gauge spinal needle  to reach the periosteum.  We used the extra long Jamshidi needle.  We  made one entry into the bone marrow into the posterior iliac crest.  We  obtained some aspirate sample which was dilute.  We made a second entry  into the iliac crest with the extra long Jamshidi needle.  We did obtain  an excellent biopsy core.   The patient tolerated the procedure well.  There were no complications.      Rose Phi. Myna Hidalgo, M.D.  Electronically Signed     PRE/MEDQ  D:  10/04/2007  T:  10/04/2007  Job:  [id number]

## 2011-02-05 NOTE — H&P (Signed)
[facility]  Patient:    SAROYA, RICCOBONO                      MRN: [id number] Adm. Date:  [id number] Disc. Date: [id number] Attending:  Deniece Ree                         History and Physical  HISTORY:                      The patient is a 45 year old multiparous female status post vaginal delivery on October 27, 2000.  Patient has indicated throughout this pregnancy her desire for permanent sterilization.  All the different types of reversible contraceptives were made available.  The patient indicates she understands; however, is very desirous of permanent sterilization.  The patient understands that this procedure is intended to  be permanent; however, cannot be guaranteed.  Her past medical history, family history, and review of systems are noncontributory.  PHYSICAL EXAMINATION:  GENERAL:                      Revealed a well-developed, well-nourished obese postpartum female in no acute distress.  HEENT:                        Within normal limits.  NECK:                         Supple.  BREASTS:                      Without masses, tenderness, or discharge.  LUNGS:                        Clear to percussion and auscultation.  HEART:                        Normal sinus rhythm without murmur, rub, or gallop.  ABDOMEN:                      Benign, obese, with the uterine fundus extending to the umbilicus.  EXTREMITIES AND NEUROLOGIC:   Within normal limits.  PELVIC:                       Not done.  DIAGNOSIS:                    Multiparity, desirous of permanent sterilization.  PLAN:                         Bilateral tubular ligation using a modified Pomeroy procedure. DD:  10/28/00 TD:  10/28/00 Job: [id number]

## 2011-02-05 NOTE — Op Note (Signed)
[facility]  Patient:    Morgan Palmer, Morgan Palmer                      MRN: [id number] Proc. Date: 10/28/00 Adm. Date:  [id number] Disc. Date: [id number] Attending:  Deniece Ree                           Operative Report  PREOPERATIVE DIAGNOSIS:       Multiparity, desires a permanent sterilization.  POSTOPERATIVE DIAGNOSIS:      Multiparity, desires a permanent sterilization.  OPERATION:                    Bilateral tubal ligation using a modified                               Pomeroy procedure.  SURGEON:                      Deniece Ree, M.D.  ANESTHESIA:                   Epidural.  ESTIMATED BLOOD LOSS:         Less than 25 cc.  COMPLICATIONS:                None.  DISPOSITION:                  The patient tolerated the procedure well and returned to the recovery room in satisfactory condition.  DESCRIPTION OF PROCEDURE:     The patient was taken to the operating room and prepped and draped in the usual fashion for postpartum sterilization procedure. A subumbilical incision was made following which this incision was carried down through the peritoneum. With some manipulation, the right tube was identified, grasped with the Babcock clamp, and followed out until the fimbriated end could be identified. It was then knuckled up and, utilizing 0 plain catgut, ligated in a routine fashion. This was done likewise on the left side. Both segments of tube were then labeled and sent to pathology. Both tubal stump areas were then cauterized with the use of a cautery. At this point, hemostasis was present. Sponge and needle count was correct x 2. The abdominoperitoneum was then closed along with the fascia using 0 chromic in a running locking stitch, followed by a subcuticular stitch using 4-0 Vicryl. The procedure was then terminated. The patient tolerated the procedure well and returned to the recovery room in satisfactory condition. DD:  10/28/00 TD:   10/28/00 Job: [id number]

## 2011-02-05 NOTE — Discharge Summary (Signed)
[facility]  Patient:    Morgan Palmer, Morgan Palmer                      MRN: [id number] Adm. Date:  [id number] Disc. Date: [id number] Attending:  Deniece Ree                           Discharge Summary  SUMMARY:                      Patient is a 45 year old multiparous female who was admitted for delivery.  On February 7 the patient underwent a vaginal delivery and was then scheduled for a bilateral tubal ligation.  The patient underwent a bilateral tubal ligation from which she tolerated procedure very well without any problems.  Postoperatively she did very well without any complications and was discharged on October 29, 2000.  She was instructed on the possible complications and care following this type of surgery.  She was told to return to my office in four weeks for followup evaluation or to call me prior to that time should any problems arise.DD:  11/30/00 TD:  11/30/00 Job: [id number]

## 2011-06-09 LAB — DIFFERENTIAL
Eosinophils Relative: 6 — ABNORMAL HIGH
Lymphocytes Relative: 11 — ABNORMAL LOW
Lymphs Abs: 0.9
Monocytes Absolute: 0.5
Monocytes Relative: 6

## 2011-06-09 LAB — CBC
HCT: 31 — ABNORMAL LOW
Hemoglobin: 10.4 — ABNORMAL LOW
RBC: 3.9
RDW: 15.6 — ABNORMAL HIGH
WBC: 8.5

## 2011-06-09 LAB — CHROMOSOME ANALYSIS, BONE MARROW

## 2011-06-25 LAB — BASIC METABOLIC PANEL
CO2: 27
GFR calc Af Amer: >60
Glucose, Bld: 61 — ABNORMAL LOW
Potassium: 3.6
Sodium: 139

## 2011-06-25 LAB — CBC
HCT: 37
Hemoglobin: 12.3
MCHC: 33.1
RBC: 4.53
RDW: 15.2

## 2013-02-15 ENCOUNTER — Telehealth: Payer: Self-pay | Admitting: Hematology & Oncology

## 2013-02-15 NOTE — Telephone Encounter (Signed)
Pt called made 6-18 appointment to see MD

## 2013-03-07 ENCOUNTER — Other Ambulatory Visit (HOSPITAL_BASED_OUTPATIENT_CLINIC_OR_DEPARTMENT_OTHER): Payer: BC Managed Care – PPO | Admitting: Lab

## 2013-03-07 ENCOUNTER — Ambulatory Visit (HOSPITAL_BASED_OUTPATIENT_CLINIC_OR_DEPARTMENT_OTHER): Payer: BC Managed Care – PPO

## 2013-03-07 ENCOUNTER — Ambulatory Visit (HOSPITAL_BASED_OUTPATIENT_CLINIC_OR_DEPARTMENT_OTHER): Payer: BC Managed Care – PPO | Admitting: Hematology & Oncology

## 2013-03-07 VITALS — BP 181/98 | HR 92 | Temp 97.6°F | Resp 18 | Ht 64.0 in | Wt 294.0 lb

## 2013-03-07 DIAGNOSIS — E890 Postprocedural hypothyroidism: Secondary | ICD-10-CM

## 2013-03-07 DIAGNOSIS — C8118 Nodular sclerosis classical Hodgkin lymphoma, lymph nodes of multiple sites: Secondary | ICD-10-CM

## 2013-03-07 LAB — CBC WITH DIFFERENTIAL (CANCER CENTER ONLY)
BASO#: 0 10*3/uL (ref 0.0–0.2)
EOS%: 4.6 % (ref 0.0–7.0)
Eosinophils Absolute: 0.3 10*3/uL (ref 0.0–0.5)
HGB: 13.6 g/dL (ref 11.6–15.9)
LYMPH%: 25.9 % (ref 14.0–48.0)
MCH: 29.6 pg (ref 26.0–34.0)
MCHC: 34.3 g/dL (ref 32.0–36.0)
MCV: 86 fL (ref 81–101)
MONO%: 5.4 % (ref 0.0–13.0)
NEUT#: 3.7 10*3/uL (ref 1.5–6.5)
Platelets: 299 10*3/uL (ref 145–400)

## 2013-03-07 LAB — COMPREHENSIVE METABOLIC PANEL
AST: 11 U/L (ref 0–37)
Albumin: 4.2 g/dL (ref 3.5–5.2)
Alkaline Phosphatase: 78 U/L (ref 39–117)
BUN: 16 mg/dL (ref 6–23)
Glucose, Bld: 94 mg/dL (ref 70–99)
Potassium: 3.9 mEq/L (ref 3.5–5.3)
Total Bilirubin: 0.4 mg/dL (ref 0.3–1.2)

## 2013-03-07 NOTE — Progress Notes (Signed)
This office note has been dictated.

## 2013-03-08 ENCOUNTER — Telehealth: Payer: Self-pay | Admitting: *Deleted

## 2013-03-08 ENCOUNTER — Telehealth: Payer: Self-pay | Admitting: Hematology & Oncology

## 2013-03-08 NOTE — Telephone Encounter (Signed)
Called patient to let her know that her labwork looks good per dr. Myna Hidalgo

## 2013-03-08 NOTE — Telephone Encounter (Signed)
Talked with pt she had me call back and leave detailed appointment information. She is aware of 6-24 mammogram,7-9 Dr. Constance Goltz and 12-18 with [place].

## 2013-03-08 NOTE — Progress Notes (Signed)
CC:   Kendrick Ranch, M.D.  DIAGNOSIS:  Stage IIA nodular sclerosing Hodgkin disease.  CURRENT THERAPY:  Observation.  INTERIM HISTORY:  Ms. Petrucci comes in for a long awaited followup.  We last saw her back in June 2011.  Since then, she has been doing okay. Her son is now 46 years old.  She is busy with him.  She just has not had time to come back to see [place].  I definitely understand this.  I do feel that she probably needs to get a family doctor.  As such, we will make a referral down to Dr. Constance Goltz.  She has had no problems with fevers, sweats, or chills.  There has been no cough or shortness of breath.  She has not noted any problems with rashes.  There has been no change in bowel or bladder habits.  She still has her monthly cycles.  She has not had a mammogram.  As such, we need to make sure this is set up.  PHYSICAL EXAMINATION:  General:  This is a morbidly obese white female in no obvious distress.  Vital signs:  Temperature of 97.6, pulse 92, respiratory rate 18, blood pressure 181/98.  Weight is 294.  Head and neck:  Normocephalic, atraumatic skull.  There are no ocular or oral lesions.  There are no palpable cervical or supraclavicular lymph nodes. Lungs:  Clear bilaterally.  Cardiac:  Regular rate and rhythm with a normal S1 and S2.  There are no murmurs, rubs, or bruits.  Abdomen: Soft with good bowel sounds.  There is no palpable abdominal mass. There is no fluid wave.  There is no palpable hepatosplenomegaly.  Back: No tenderness over the spine, ribs, or hips.  Extremities:  No clubbing, cyanosis, or edema.  Neurological:  No focal neurological deficits.  LABORATORY STUDIES:  White cell count is 5.7, hemoglobin 13.6, hematocrit 39.7, platelet count 299.  IMPRESSION:  Ms. Melder is a very charming 47 year old African American female with stage IIA nodular sclerosing Hodgkin disease.  She had bulky disease.  She was treated with 4 cycles ABVD.  She then  underwent radiation therapy.  She completed all this back in May 2009.  I do not see that recurrent Hodgkin is a problem for her.  I do worry that hypothyroidism could occur.  I am checking a TSH on her.  Her blood pressure is quite high.  This sometimes happens when she comes to visit [place].  I think the most important thing with Ms. Houchin is that she must get to a family doctor so that her general medical needs are attended to.  I want to see her back in 6 months' time.    ______________________________ Josph Macho, M.D. PRE/MEDQ  D:  03/07/2013  T:  03/08/2013  Job:  [id number]

## 2013-03-13 ENCOUNTER — Ambulatory Visit: Payer: BC Managed Care – PPO

## 2013-03-21 ENCOUNTER — Ambulatory Visit
Admission: RE | Admit: 2013-03-21 | Discharge: 2013-03-21 | Disposition: A | Discharge status: Home / Self Care | Payer: BC Managed Care – PPO | Source: Ambulatory Visit | Attending: Hematology & Oncology | Admitting: Hematology & Oncology

## 2013-03-21 DIAGNOSIS — C8118 Nodular sclerosis classical Hodgkin lymphoma, lymph nodes of multiple sites: Secondary | ICD-10-CM

## 2013-03-28 ENCOUNTER — Ambulatory Visit (INDEPENDENT_AMBULATORY_CARE_PROVIDER_SITE_OTHER): Payer: BC Managed Care – PPO | Admitting: Internal Medicine

## 2013-03-28 ENCOUNTER — Encounter: Payer: Self-pay | Admitting: Internal Medicine

## 2013-03-28 VITALS — BP 152/92 | HR 82 | Temp 98.2°F | Resp 18 | Ht 66.0 in | Wt 291.0 lb

## 2013-03-28 DIAGNOSIS — I10 Essential (primary) hypertension: Secondary | ICD-10-CM

## 2013-03-28 DIAGNOSIS — E669 Obesity, unspecified: Secondary | ICD-10-CM

## 2013-03-28 DIAGNOSIS — C8118 Nodular sclerosis classical Hodgkin lymphoma, lymph nodes of multiple sites: Secondary | ICD-10-CM

## 2013-03-28 DIAGNOSIS — E785 Hyperlipidemia, unspecified: Secondary | ICD-10-CM

## 2013-03-28 DIAGNOSIS — M1990 Unspecified osteoarthritis, unspecified site: Secondary | ICD-10-CM

## 2013-03-28 MED ORDER — TRIAMTERENE-HCTZ 37.5-25 MG PO TABS: 1.0000 | ORAL_TABLET | Freq: Every day | ORAL | Status: DC

## 2013-03-28 NOTE — Progress Notes (Signed)
Subjective:    Patient ID: Morgan Palmer, female    DOB: [birth year], 46 y.o.   MRN: [id number]  HPI Morgan Palmer is a new pt here to establish care.  PMH  Hodgkins lymphoma  S/P CTX and XRT,  DJD, morbid obesity,  and HTN (she has been without meds for years)  She has been without primary care for quite some time  As she was "taking care of everybody else".  Strong FH of HTN  "everybody".  She is asymptomatic and cannot tell when her BP is up.   MM done yesterday shows no  Evidence of malignancy.   She uses MOtrin on the week-ends sat and Sun when she is at work.  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 Arthritis   . Cancer     Non Hodgkins Lymphoma   Past Surgical History  Procedure Laterality Date  . Breast surgery      biopsy  . Lymph node biopsy     History   Social History  . Marital Status: Single    Spouse Name: N/A    Number of Children: N/A  . Years of Education: N/A   Occupational History  . Not on file.   Social History Main Topics  . Smoking status: Never Smoker   . Smokeless tobacco: Never Used  . Alcohol Use: Yes  . Drug Use: No  . Sexually Active: No   Other Topics Concern  . Not on file   Social History Narrative  . No narrative on file   Family History  Problem Relation Age of Onset  . Hypertension Mother   . Diabetes Mother   . Hypertension Father   . Hypertension Brother   . Cancer Maternal Aunt     breast  . Hypertension Maternal Grandmother   . Cancer Maternal Grandfather     prostate  . Cancer Paternal Grandmother     breast   Patient Active Problem List   Diagnosis Date Noted  . ACUTE BRONCHITIS 01/27/2010  . 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 ANEMIA 06/22/2007  . ALLERGIC RHINITIS 06/22/2007  . HYPERLIPIDEMIA 02/04/2007  . OBESITY NOS 02/04/2007  . HYPERTENSION  02/04/2007  . GERD 02/04/2007  . OSTEOARTHRITIS 02/04/2007   Current Outpatient Prescriptions on File Prior to Visit  Medication Sig Dispense Refill  . ibuprofen (ADVIL,MOTRIN) 200 MG tablet Take 200 mg by mouth every 8 (eight) hours as needed for pain.      . NON FORMULARY Take by mouth every morning. Herbal Life.       No current facility-administered medications on file prior to visit.       Review of Systems    see HPI Objective:   Physical Exam Physical Exam  Nursing note and vitals reviewed.  Constitutional: She is oriented to person, place, and time. She appears well-developed and well-nourished.  HENT:  Head: Normocephalic and atraumatic.  Cardiovascular: Normal rate and regular rhythm. Exam reveals no gallop and no friction rub.  No murmur heard.  Pulmonary/Chest: Breath sounds normal. She has no wheezes. She has no rales.  Neurological: She is alert and oriented to person, place, and time.  Skin: Skin is warm and dry.  Psychiatric: She has a normal mood and affect. Her behavior is normal.  Assessment & Plan:  HTN:  She has been on diuretic in the distant past  Will start maxizide 37.5/25  One daily.   See me in two weeks  DJD  OK for motrin on Sat sun  Hodgkins lymphoma  See Dr. Tama Gander note  MOrbid obesity  See me in 2 weeks,  Will need to schedule CPE,  Lipids when fasting along with vitamin D

## 2013-03-28 NOTE — Patient Instructions (Signed)
See me in 2 weeks  Get fasting labs done

## 2013-04-11 ENCOUNTER — Ambulatory Visit (INDEPENDENT_AMBULATORY_CARE_PROVIDER_SITE_OTHER): Payer: BC Managed Care – PPO | Admitting: Internal Medicine

## 2013-04-11 ENCOUNTER — Encounter: Payer: Self-pay | Admitting: Internal Medicine

## 2013-04-11 VITALS — BP 133/93 | HR 96 | Temp 97.3°F | Resp 18 | Wt 290.0 lb

## 2013-04-11 DIAGNOSIS — E785 Hyperlipidemia, unspecified: Secondary | ICD-10-CM

## 2013-04-11 DIAGNOSIS — I10 Essential (primary) hypertension: Secondary | ICD-10-CM

## 2013-04-11 DIAGNOSIS — E669 Obesity, unspecified: Secondary | ICD-10-CM

## 2013-04-11 NOTE — Patient Instructions (Signed)
Schedule CPE with me 

## 2013-04-11 NOTE — Progress Notes (Signed)
  Subjective:    Patient ID: Morgan Palmer, female    DOB: May 11, 1966, 47 y.o.   MRN: [id number]  HPI  Morgan Palmer is here for follow up.    She has started her Maxzide and tolerating well.  No edema.  No headaches.    No Known Allergies Past Medical History  Diagnosis Date  . Arthritis   . Cancer     Non Hodgkins Lymphoma   Past Surgical History  Procedure Laterality Date  . Breast surgery      biopsy  . Lymph node biopsy     History   Social History  . Marital Status: Single    Spouse Name: N/A    Number of Children: N/A  . Years of Education: N/A   Occupational History  . Not on file.   Social History Main Topics  . Smoking status: Never Smoker   . Smokeless tobacco: Never Used  . Alcohol Use: Yes  . Drug Use: No  . Sexually Active: No   Other Topics Concern  . Not on file   Social History Narrative  . No narrative on file   Family History  Problem Relation Age of Onset  . Hypertension Mother   . Diabetes Mother   . Hypertension Father   . Hypertension Brother   . Cancer Maternal Aunt     breast  . Hypertension Maternal Grandmother   . Cancer Maternal Grandfather     prostate  . Cancer Paternal Grandmother     breast   Patient Active Problem List   Diagnosis Date Noted  . ACUTE BRONCHITIS 01/27/2010  . 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 ANEMIA 06/22/2007  . ALLERGIC RHINITIS 06/22/2007  . HYPERLIPIDEMIA 02/04/2007  . OBESITY NOS 02/04/2007  . HYPERTENSION 02/04/2007  . GERD 02/04/2007  . OSTEOARTHRITIS 02/04/2007   Current Outpatient Prescriptions on File Prior to Visit  Medication Sig Dispense Refill  . ibuprofen (ADVIL,MOTRIN) 200 MG tablet Take 200 mg by mouth every 8 (eight) hours as needed for pain.      . NON FORMULARY Take by mouth every morning. Herbal Life.      . triamterene-hydrochlorothiazide (MAXZIDE-25) 37.5-25 MG per tablet Take 1 tablet by mouth daily.  90 tablet  1   No current  facility-administered medications on file prior to visit.      Review of Systems See HPI    Objective:   Physical Exam  Physical Exam  Nursing note and vitals reviewed. Repeat BP  130/86 Constitutional: She is oriented to person, place, and time. She appears well-developed and well-nourished.  HENT:  Head: Normocephalic and atraumatic.  Cardiovascular: Normal rate and regular rhythm. Exam reveals no gallop and no friction rub.  No murmur heard.  Pulmonary/Chest: Breath sounds normal. She has no wheezes. She has no rales.  Neurological: She is alert and oriented to person, place, and time.  Skin: Skin is warm and dry.  Ext  No edema Psychiatric: She has a normal mood and affect. Her behavior is normal.         Assessment & Plan:  Htn  Continue Maxzide  Hyperlipidemia  Will check fasting labs this am  Obesity  She Tells me she is exercising at [employer].    Schedule CPE

## 2013-04-12 LAB — LIPID PANEL
Cholesterol: 179 mg/dL (ref 0–200)
HDL: 42 mg/dL (ref >39)
LDL Cholesterol: 115 mg/dL — ABNORMAL HIGH (ref 0–99)
Total CHOL/HDL Ratio: 4.3 Ratio
Triglycerides: 111 mg/dL (ref <150)
VLDL: 22 mg/dL (ref 0–40)

## 2013-04-14 ENCOUNTER — Telehealth: Payer: Self-pay | Admitting: Internal Medicine

## 2013-04-14 DIAGNOSIS — E559 Vitamin D deficiency, unspecified: Secondary | ICD-10-CM | POA: Insufficient documentation

## 2013-04-14 MED ORDER — VITAMIN D (ERGOCALCIFEROL) 1.25 MG (50000 UNIT) PO CAPS: 50000.0000 [IU] | ORAL_CAPSULE | ORAL | Status: DC

## 2013-04-14 NOTE — Telephone Encounter (Signed)
Morgan Palmer    Call pt and let her know that her Vitamin D is low.  I will order in EPIC to [facility]  50,000 units of vitamin D to be taken once a week for 8 weeks.    I will recheck her vitamin D at our next visit   OK to mail labs to her

## 2013-04-16 NOTE — Telephone Encounter (Signed)
Notified pt that vit D was low and rx was at pharmacy

## 2013-04-30 ENCOUNTER — Other Ambulatory Visit: Payer: Self-pay | Admitting: *Deleted

## 2013-04-30 MED ORDER — LANSOPRAZOLE 15 MG PO CPDR: 15.0000 mg | DELAYED_RELEASE_CAPSULE | Freq: Every day | ORAL | Status: DC

## 2013-04-30 NOTE — Telephone Encounter (Signed)
Refill request pt requesting this sent in as a RX

## 2013-05-02 ENCOUNTER — Other Ambulatory Visit: Payer: Self-pay | Admitting: Internal Medicine

## 2013-05-02 NOTE — Telephone Encounter (Signed)
Morgan Palmer  I only want her to take 8 tablets of this high dose which was ordered in July  Call pharmacy and clarify  If she has finished her 8 tablets, she can take 1000 units of vitamin D3

## 2013-05-02 NOTE — Telephone Encounter (Signed)
Advised pt to take 1000 units of Vit D daily

## 2013-05-02 NOTE — Telephone Encounter (Signed)
Refill request is she to continue this

## 2013-05-15 ENCOUNTER — Ambulatory Visit (INDEPENDENT_AMBULATORY_CARE_PROVIDER_SITE_OTHER): Payer: BC Managed Care – PPO | Admitting: Internal Medicine

## 2013-05-15 ENCOUNTER — Encounter: Payer: Self-pay | Admitting: Internal Medicine

## 2013-05-15 VITALS — BP 116/75 | HR 72 | Temp 97.6°F | Resp 18 | Wt 289.0 lb

## 2013-05-15 DIAGNOSIS — Z1151 Encounter for screening for human papillomavirus (HPV): Secondary | ICD-10-CM

## 2013-05-15 DIAGNOSIS — E559 Vitamin D deficiency, unspecified: Secondary | ICD-10-CM

## 2013-05-15 DIAGNOSIS — I10 Essential (primary) hypertension: Secondary | ICD-10-CM

## 2013-05-15 DIAGNOSIS — M1990 Unspecified osteoarthritis, unspecified site: Secondary | ICD-10-CM

## 2013-05-15 DIAGNOSIS — Z124 Encounter for screening for malignant neoplasm of cervix: Secondary | ICD-10-CM

## 2013-05-15 DIAGNOSIS — Z0000 Encounter for general adult medical examination without abnormal findings: Secondary | ICD-10-CM

## 2013-05-15 DIAGNOSIS — E785 Hyperlipidemia, unspecified: Secondary | ICD-10-CM

## 2013-05-15 LAB — POCT URINALYSIS DIPSTICK
Blood, UA: NEGATIVE
Ketones, UA: NEGATIVE
Protein, UA: NEGATIVE
Spec Grav, UA: 1.015
Urobilinogen, UA: NEGATIVE
pH, UA: 6.5

## 2013-05-15 MED ORDER — IBUPROFEN 800 MG PO TABS: ORAL_TABLET | ORAL | Status: DC

## 2013-05-15 NOTE — Patient Instructions (Addendum)
See me if back symptoms are worse

## 2013-05-15 NOTE — Progress Notes (Signed)
Subjective:    Patient ID: Morgan Palmer, female    DOB: 1966/08/10, 47 y.o.   MRN: [id number]  HPI Morgan Palmer is here for CPE  Overall doing well.  Has not had a pap smear in about 4 years.  No history of abnormal pap  HTN  Well controlled with maxzide  She tells me she hurt her back moving her son's bed yesterday while cleaning his room.  Low back discomfort  No paresthesias  No radiation down her left leg  No Known Allergies Past Medical History  Diagnosis Date  . Arthritis   . Cancer     Non Hodgkins Lymphoma   Past Surgical History  Procedure Laterality Date  . Breast surgery      biopsy  . Lymph node biopsy     History   Social History  . Marital Status: Single    Spouse Name: N/A    Number of Children: N/A  . Years of Education: N/A   Occupational History  . Not on file.   Social History Main Topics  . Smoking status: Never Smoker   . Smokeless tobacco: Never Used  . Alcohol Use: Yes  . Drug Use: No  . Sexual Activity: No   Other Topics Concern  . Not on file   Social History Narrative  . No narrative on file   Family History  Problem Relation Age of Onset  . Hypertension Mother   . Diabetes Mother   . Hypertension Father   . Hypertension Brother   . Cancer Maternal Aunt     breast  . Hypertension Maternal Grandmother   . Cancer Maternal Grandfather     prostate  . Cancer Paternal Grandmother     breast   Patient Active Problem List   Diagnosis Date Noted  . Morbid obesity 05/15/2013  . Vitamin D deficiency 04/14/2013  . ACUTE BRONCHITIS 01/27/2010  . 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 ANEMIA 06/22/2007  . ALLERGIC RHINITIS 06/22/2007  . HYPERLIPIDEMIA 02/04/2007  . OBESITY NOS 02/04/2007  . HYPERTENSION 02/04/2007  . GERD 02/04/2007  . OSTEOARTHRITIS 02/04/2007   Current Outpatient Prescriptions on File Prior to Visit  Medication Sig Dispense Refill  . ibuprofen (ADVIL,MOTRIN) 200 MG tablet Take 200 mg by  mouth every 8 (eight) hours as needed for pain.      Marland Kitchen lansoprazole (PREVACID) 15 MG capsule Take 1 capsule (15 mg total) by mouth daily.  90 capsule  1  . triamterene-hydrochlorothiazide (MAXZIDE-25) 37.5-25 MG per tablet Take 1 tablet by mouth daily.  90 tablet  1  . Vitamin D, Ergocalciferol, (DRISDOL) 50000 UNITS CAPS Take 1 capsule (50,000 Units total) by mouth every 7 (seven) days.  8 capsule  0  . NON FORMULARY Take by mouth every morning. Herbal Life.       No current facility-administered medications on file prior to visit.       Review of Systems    see HPI Objective:   Physical Exam Physical Exam  Vital signs and nursing note reviewed  Constitutional: She is oriented to person, place, and time. She appears well-developed and well-nourished. She is cooperative.  HENT:  Head: Normocephalic and atraumatic.  Right Ear: Tympanic membrane normal.  Left Ear: Tympanic membrane normal.  Nose: Nose normal.  Mouth/Throat: Oropharynx is clear and moist and mucous membranes are normal. No oropharyngeal exudate or posterior oropharyngeal erythema.  Eyes: Conjunctivae and EOM are normal. Pupils are equal, round, and reactive to light.  Neck: Neck supple. No JVD present. Carotid bruit is not present. No mass and no thyromegaly present.  Cardiovascular: Regular rhythm, normal heart sounds, intact distal pulses and normal pulses.  Exam reveals no gallop and no friction rub.   No murmur heard. Pulses:      Dorsalis pedis pulses are 2+ on the right side, and 2+ on the left side.  Pulmonary/Chest: Breath sounds normal. She has no wheezes. She has no rhonchi. She has no rales. Right breast exhibits no mass, no nipple discharge and no skin change. Left breast exhibits no mass, no nipple discharge and no skin change.  Abdominal: Soft. Bowel sounds are normal. She exhibits no distension and no mass. There is no hepatosplenomegaly. There is no tenderness. There is no CVA tenderness.   Genitourinary: Rectum normal, vagina normal and uterus normal. Rectal exam shows no mass. Guaiac negative stool. No labial fusion. There is no lesion on the right labia. There is no lesion on the left labia. Cervix exhibits no motion tenderness. Right adnexum displays no mass, no tenderness and no fullness. Left adnexum displays no mass, no tenderness and no fullness. No erythema around the vagina.  Musculoskeletal:       No active synovitis to any joint.    Lymphadenopathy:       Right cervical: No superficial cervical adenopathy present.      Left cervical: No superficial cervical adenopathy present.       Right axillary: No pectoral and no lateral adenopathy present.       Left axillary: No pectoral and no lateral adenopathy present.      Right: No inguinal adenopathy present.       Left: No inguinal adenopathy present.  Neurological: She is alert and oriented to person, place, and time. She has normal strength and normal reflexes. No cranial nerve deficit or sensory deficit. She displays a negative Romberg sign. Coordination and gait normal.  LE  Reflexes @+ symmetric Motor 5/5 UE and LE Skin: Skin is warm and dry. No abrasion, no bruising, no ecchymosis and no rash noted. No cyanosis. Nails show no clubbing.  Psychiatric: She has a normal mood and affect. Her speech is normal and behavior is normal.          Assessment & Plan:  Health Maintenance:   Influenza today as she works at [employer].  Pap today    See scanned sheet  UTD  With mm and labs  HTN well controlled   Continue Maxide  Lumbar strain  OK for Ibuprofen 800 mg bid for next 5 days.  Neurologic LE normal exam  Vitamin D deficiency she is completing her weekly vitamin D will check level next visit   Minimal  Hyperlipidemia  Follow DASH diet  Morbid obesity  Declines  Nutritionist referral now  OA       Assessment & Plan:

## 2013-05-17 ENCOUNTER — Telehealth: Payer: Self-pay | Admitting: *Deleted

## 2013-05-17 ENCOUNTER — Other Ambulatory Visit: Payer: Self-pay | Admitting: Internal Medicine

## 2013-05-17 MED ORDER — FLUCONAZOLE 150 MG PO TABS: 150.0000 mg | ORAL_TABLET | Freq: Once | ORAL | Status: DC

## 2013-05-17 NOTE — Telephone Encounter (Signed)
Medication reordered to local pharmacy

## 2013-05-17 NOTE — Telephone Encounter (Signed)
Message copied by Mathews Robinsons on Thu May 17, 2013 11:00 AM ------      Message from: Raechel Chute D      Created: Thu May 17, 2013 10:19 AM       Karen Kitchens            Call pt and let her know that her pap smear is normal but she has a few yeast cells  I will e-scribe one Diflucan 150mg  to take             She should repeat her pap in 1 year ------

## 2013-05-28 ENCOUNTER — Encounter: Payer: Self-pay | Admitting: *Deleted

## 2013-09-06 ENCOUNTER — Ambulatory Visit (HOSPITAL_BASED_OUTPATIENT_CLINIC_OR_DEPARTMENT_OTHER): Payer: BC Managed Care – PPO | Admitting: Hematology & Oncology

## 2013-09-06 ENCOUNTER — Ambulatory Visit (HOSPITAL_BASED_OUTPATIENT_CLINIC_OR_DEPARTMENT_OTHER): Payer: BC Managed Care – PPO | Admitting: Lab

## 2013-09-06 VITALS — BP 129/75 | HR 76 | Temp 97.7°F | Resp 14 | Ht 65.0 in | Wt 285.0 lb

## 2013-09-06 DIAGNOSIS — D649 Anemia, unspecified: Secondary | ICD-10-CM

## 2013-09-06 DIAGNOSIS — E039 Hypothyroidism, unspecified: Secondary | ICD-10-CM

## 2013-09-06 DIAGNOSIS — C8118 Nodular sclerosis classical Hodgkin lymphoma, lymph nodes of multiple sites: Secondary | ICD-10-CM

## 2013-09-06 DIAGNOSIS — C8119 Nodular sclerosis classical Hodgkin lymphoma, extranodal and solid organ sites: Secondary | ICD-10-CM

## 2013-09-06 LAB — COMPREHENSIVE METABOLIC PANEL
ALT: 15 U/L (ref 0–35)
Alkaline Phosphatase: 81 U/L (ref 39–117)
CO2: 27 mEq/L (ref 19–32)
Creatinine, Ser: 0.71 mg/dL (ref 0.50–1.10)
Glucose, Bld: 88 mg/dL (ref 70–99)
Total Bilirubin: 0.4 mg/dL (ref 0.3–1.2)

## 2013-09-06 LAB — CBC WITH DIFFERENTIAL (CANCER CENTER ONLY)
BASO%: 0.2 % (ref 0.0–2.0)
HCT: 37.7 % (ref 34.8–46.6)
LYMPH%: 28.2 % (ref 14.0–48.0)
MCH: 30.1 pg (ref 26.0–34.0)
MCV: 87 fL (ref 81–101)
MONO#: 0.3 10*3/uL (ref 0.1–0.9)
MONO%: 5.2 % (ref 0.0–13.0)
NEUT%: 65.1 % (ref 39.6–80.0)
Platelets: 295 10*3/uL (ref 145–400)
RDW: 12.9 % (ref 11.1–15.7)
WBC: 6.4 10*3/uL (ref 3.9–10.0)

## 2013-09-06 LAB — LACTATE DEHYDROGENASE: LDH: 133 U/L (ref 94–250)

## 2013-09-06 LAB — TSH CHCC: TSH: 1.815 m(IU)/L (ref 0.308–3.960)

## 2013-09-06 NOTE — Progress Notes (Signed)
This office note has been dictated.

## 2013-09-07 ENCOUNTER — Telehealth: Payer: Self-pay | Admitting: *Deleted

## 2013-09-07 NOTE — Telephone Encounter (Signed)
Message copied by Anselm Jungling on Fri Sep 07, 2013  5:22 PM ------      Message from: Josph Macho      Created: Fri Sep 07, 2013  6:24 AM       Please call and let her know that all of her labs including her thyroid are normal. Altamese Cabal Christmas!!! Cindee Lame ------

## 2013-09-07 NOTE — Telephone Encounter (Signed)
Called patient to let her know that her thyroid levels are normal.

## 2013-09-10 NOTE — Progress Notes (Signed)
CC:   Morgan Palmer, M.D.  DIAGNOSIS:  Stage IIA nodular sclerosing Hodgkin disease, clinical remission.  CURRENT THERAPY:  Observation.  INTERIM HISTORY:  Morgan Palmer comes in for followup.  She is doing incredibly well.  It has now been 5-1/2 years since she completed her chemotherapy.  She got 4 cycles of ABVD followed by radiation.  All this was completed back in May of 2009.  She is doing well.  She is expecting her first grandchild, I think, in a month.  We do watch her thyroid.  So far, there has been no issues with her thyroid being hypothyroid.  She has had no cough or shortness of breath.  She has had no headache. She has had no bony pain.  She is losing some weight.  There has been no change in bowel or bladder habits.  She had her last mammogram back in July.  Her mammogram looked fantastic.  PHYSICAL EXAMINATION:  General:  This is a somewhat obese African American female in no obvious distress.  Vital Signs:  Temperature of 97.7, pulse 76, respiratory rate 14, blood pressure 129/75, weight is 185 pounds.  Head and Neck:  Normocephalic, atraumatic skull.  There are no ocular or oral lesions.  She has no palpable cervical or supraclavicular lymph nodes.  Lungs:  Clear bilaterally.  Cardiac: Regular rate and rhythm with a normal S1 and S2.  There are no murmurs, rubs, or bruits.  Abdomen:  Soft.  She is somewhat obese.  She has good bowel sounds.  There is no fluid wave.  There is no palpable abdominal mass.  There is no palpable hepatosplenomegaly.  Back:  No tenderness over the spine, ribs, or hips.  Extremities:  Show no clubbing, cyanosis, or edema.  Skin:  Shows no rashes, ecchymoses or petechia.  LABORATORY STUDIES:  White cell count is 6.4, hemoglobin 13, hematocrit 37.7, platelet count 295.  IMPRESSION:  Morgan Palmer is a very charming 47 year old African American female.  She had stage IIA nodular sclerosing Hodgkin disease.  We gave her chemo and  radiation therapy.  She is cured from my point of view.  We have to watch her thyroid.  She is still at risk for hypothyroidism.  We will plan to see her back in another 6 months.  She will need to have a mammogram next July.    ______________________________ Josph Macho, M.D. PRE/MEDQ  D:  09/06/2013  T:  09/07/2013  Job:  [id number]

## 2013-11-14 ENCOUNTER — Other Ambulatory Visit: Payer: Self-pay | Admitting: *Deleted

## 2013-11-14 ENCOUNTER — Telehealth: Payer: Self-pay | Admitting: *Deleted

## 2013-11-14 NOTE — Telephone Encounter (Signed)
Morgan Palmer would like a RX for 800 mg Ibuprofen.

## 2013-11-19 ENCOUNTER — Other Ambulatory Visit: Payer: Self-pay | Admitting: *Deleted

## 2013-11-19 ENCOUNTER — Telehealth: Payer: Self-pay | Admitting: *Deleted

## 2013-11-19 NOTE — Telephone Encounter (Signed)
Refill request

## 2013-11-19 NOTE — Telephone Encounter (Signed)
Pt states that she would like a RX for ibuprofen 800 mg. [employer] card will pay for RX ibuprofen she would like it for her arthritis in her knees

## 2013-11-20 MED ORDER — IBUPROFEN 800 MG PO TABS: ORAL_TABLET | ORAL | Status: AC

## 2013-11-20 MED ORDER — LANSOPRAZOLE 15 MG PO CPDR: 15.0000 mg | DELAYED_RELEASE_CAPSULE | Freq: Every day | ORAL | Status: DC

## 2013-12-17 ENCOUNTER — Telehealth: Payer: Self-pay | Admitting: *Deleted

## 2013-12-17 NOTE — Telephone Encounter (Signed)
Pt has been running around taking care of her elderly parents parents states that she would like to see Dr Coralyn Mark because she needs a LOA from work

## 2013-12-19 ENCOUNTER — Encounter: Payer: Self-pay | Admitting: Internal Medicine

## 2013-12-19 ENCOUNTER — Ambulatory Visit (INDEPENDENT_AMBULATORY_CARE_PROVIDER_SITE_OTHER): Payer: BC Managed Care – PPO | Admitting: Internal Medicine

## 2013-12-19 VITALS — BP 138/84 | HR 84 | Temp 98.2°F | Resp 20 | Wt 296.0 lb

## 2013-12-19 DIAGNOSIS — F439 Reaction to severe stress, unspecified: Secondary | ICD-10-CM | POA: Insufficient documentation

## 2013-12-19 DIAGNOSIS — I10 Essential (primary) hypertension: Secondary | ICD-10-CM

## 2013-12-19 DIAGNOSIS — Z733 Stress, not elsewhere classified: Secondary | ICD-10-CM

## 2013-12-19 NOTE — Patient Instructions (Signed)
See me in 2-3 weeks 30 mins

## 2013-12-19 NOTE — Progress Notes (Signed)
Subjective:    Patient ID: Morgan Palmer, female    DOB: Dec 19, 1965, 48 y.o.   MRN: [id number]  HPI Morgan Palmer is here for acute visit.   She is tearful during visit.  She is under significant stress due to illness of both parents.  Father had recent leg amputation. Mother hospitalized receiveing 5 blood transfusions.  She has two brothers but they are not helping .  She feels she needs a few days from work .  Denies daily depression or anhedonia  No S/H ideation .    "I just need a few days to get things together"  Sleleping OK but will wake up early at times with difficulty getting back to sleep  See BP  She does not take her Maxide on weekends.  Repeat exam  138/84  She is asymptomatic  No Known Allergies Past Medical History  Diagnosis Date  . Arthritis   . Cancer     Non Hodgkins Lymphoma   Past Surgical History  Procedure Laterality Date  . Breast surgery      biopsy  . Lymph node biopsy     History   Social History  . Marital Status: Single    Spouse Name: N/A    Number of Children: N/A  . Years of Education: N/A   Occupational History  . Not on file.   Social History Main Topics  . Smoking status: Never Smoker   . Smokeless tobacco: Never Used  . Alcohol Use: Yes  . Drug Use: No  . Sexual Activity: No   Other Topics Concern  . Not on file   Social History Narrative  . No narrative on file   Family History  Problem Relation Age of Onset  . Hypertension Mother   . Diabetes Mother   . Hypertension Father   . Hypertension Brother   . Cancer Maternal Aunt     breast  . Hypertension Maternal Grandmother   . Cancer Maternal Grandfather     prostate  . Cancer Paternal Grandmother     breast   Patient Active Problem List   Diagnosis Date Noted  . Situational stress 12/19/2013  . Morbid obesity 05/15/2013  . Vitamin D deficiency 04/14/2013  . ACUTE BRONCHITIS 01/27/2010  . 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 ANEMIA 06/22/2007    . ALLERGIC RHINITIS 06/22/2007  . HYPERLIPIDEMIA 02/04/2007  . OBESITY NOS 02/04/2007  . HYPERTENSION 02/04/2007  . GERD 02/04/2007  . OSTEOARTHRITIS 02/04/2007   Current Outpatient Prescriptions on File Prior to Visit  Medication Sig Dispense Refill  . ibuprofen (ADVIL,MOTRIN) 800 MG tablet One po bid for 5 days  10 tablet  1  . lansoprazole (PREVACID) 15 MG capsule Take 1 capsule (15 mg total) by mouth daily.  90 capsule  1  . Multiple Vitamins-Minerals (MULTIPLE VITAMINS/WOMENS PO) Take by mouth 2 (two) times daily.      Marland Kitchen triamterene-hydrochlorothiazide (MAXZIDE-25) 37.5-25 MG per tablet Take 1 tablet by mouth daily.  90 tablet  1   No current facility-administered medications on file prior to visit.       Review of Systems See HPI    Objective:   Physical Exam Physical Exam  Nursing note and vitals reviewed.   Teary during interview Constitutional: She is oriented to person, place, and time. She appears well-developed and well-nourished.  HENT:  Head: Normocephalic and atraumatic.  Cardiovascular: Normal rate and regular rhythm. Exam reveals no gallop and no friction rub.  No  murmur heard.  Pulmonary/Chest: Breath sounds normal. She has no wheezes. She has no rales.  Neurological: She is alert and oriented to person, place, and time.  Skin: Skin is warm and dry.  Psychiatric: She has a normal mood and affect. Her behavior is normal.              Assessment & Plan:  HTN  Advised to take Maxide daily  situationa stress   OK to take one week off work    Pt to folllow up with me in 2-3 weeks

## 2014-01-08 ENCOUNTER — Encounter: Payer: Self-pay | Admitting: Internal Medicine

## 2014-01-08 ENCOUNTER — Ambulatory Visit (INDEPENDENT_AMBULATORY_CARE_PROVIDER_SITE_OTHER): Payer: BC Managed Care – PPO | Admitting: Internal Medicine

## 2014-01-08 VITALS — BP 138/93 | HR 81 | Temp 98.0°F | Resp 18 | Wt 289.0 lb

## 2014-01-08 DIAGNOSIS — M1710 Unilateral primary osteoarthritis, unspecified knee: Secondary | ICD-10-CM

## 2014-01-08 DIAGNOSIS — IMO0002 Reserved for concepts with insufficient information to code with codable children: Secondary | ICD-10-CM

## 2014-01-08 DIAGNOSIS — M1990 Unspecified osteoarthritis, unspecified site: Secondary | ICD-10-CM

## 2014-01-08 DIAGNOSIS — I10 Essential (primary) hypertension: Secondary | ICD-10-CM

## 2014-01-08 DIAGNOSIS — M1711 Unilateral primary osteoarthritis, right knee: Secondary | ICD-10-CM

## 2014-01-08 MED ORDER — NABUMETONE 500 MG PO TABS: 500.0000 mg | ORAL_TABLET | Freq: Two times a day (BID) | ORAL | Status: DC

## 2014-01-08 NOTE — Progress Notes (Signed)
Subjective:    Patient ID: Morgan Palmer, female    DOB: 12/31/1965, 48 y.o.   MRN: [id number]  HPI  Morgan Palmer is here for follow up HTN and situational stress.    She is taking her maxide daily and is asymptomatic.    She is happy that she has lost a few pounds.   Father is back in hospital.    Morgan Palmer is adjusting Ok and is working now  Her arthritis in her knee is bothering her.    She would like to see an orthopedic.    No Known Allergies Past Medical History  Diagnosis Date  . Arthritis   . Cancer     Non Hodgkins Lymphoma   Past Surgical History  Procedure Laterality Date  . Breast surgery      biopsy  . Lymph node biopsy     History   Social History  . Marital Status: Single    Spouse Name: N/A    Number of Children: N/A  . Years of Education: N/A   Occupational History  . Not on file.   Social History Main Topics  . Smoking status: Never Smoker   . Smokeless tobacco: Never Used  . Alcohol Use: Yes  . Drug Use: No  . Sexual Activity: No   Other Topics Concern  . Not on file   Social History Narrative  . No narrative on file   Family History  Problem Relation Age of Onset  . Hypertension Mother   . Diabetes Mother   . Hypertension Father   . Hypertension Brother   . Cancer Maternal Aunt     breast  . Hypertension Maternal Grandmother   . Cancer Maternal Grandfather     prostate  . Cancer Paternal Grandmother     breast   Patient Active Problem List   Diagnosis Date Noted  . 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 Situational stress 12/19/2013  . Morbid obesity 05/15/2013  . Vitamin D deficiency 04/14/2013  . ACUTE BRONCHITIS 01/27/2010  . 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 ANEMIA 06/22/2007  . ALLERGIC RHINITIS 06/22/2007  . HYPERLIPIDEMIA 02/04/2007  . OBESITY NOS 02/04/2007  . HYPERTENSION 02/04/2007  . GERD 02/04/2007  . OSTEOARTHRITIS 02/04/2007   Current Outpatient  Prescriptions on File Prior to Visit  Medication Sig Dispense Refill  . ibuprofen (ADVIL,MOTRIN) 800 MG tablet One po bid for 5 days  10 tablet  1  . lansoprazole (PREVACID) 15 MG capsule Take 1 capsule (15 mg total) by mouth daily.  90 capsule  1  . Multiple Vitamins-Minerals (MULTIPLE VITAMINS/WOMENS PO) Take by mouth 2 (two) times daily.      Marland Kitchen triamterene-hydrochlorothiazide (MAXZIDE-25) 37.5-25 MG per tablet Take 1 tablet by mouth daily.  90 tablet  1   No current facility-administered medications on file prior to visit.      Review of Systems See HPI    Objective:   Physical Exam Physical Exam  Nursing note and vitals reviewed.  Constitutional: She is oriented to person, place, and time. She appears well-developed and well-nourished.  HENT:  Head: Normocephalic and atraumatic.  Cardiovascular: Normal rate and regular rhythm. Exam reveals no gallop and no friction rub.  No murmur heard.  Pulmonary/Chest: Breath sounds normal. She has no wheezes. She has no rales.  Neurological: She is alert and oriented to person, place, and time.  Skin: Skin is warm and dry.  Psychiatric: She has a normal  mood and affect. Her behavior is normal.         Assessment & Plan:  HTN:  Continue Maxide  '  OA will refer to ortho.    Situational stres improving  Schedule CPE with me

## 2014-01-08 NOTE — Patient Instructions (Signed)
Set up referral to Dr. Almedia Balls    Schedule CPE

## 2014-01-20 ENCOUNTER — Other Ambulatory Visit: Payer: Self-pay | Admitting: Internal Medicine

## 2014-01-21 NOTE — Telephone Encounter (Signed)
Refill request

## 2014-01-25 ENCOUNTER — Telehealth: Payer: Self-pay | Admitting: *Deleted

## 2014-01-25 NOTE — Telephone Encounter (Signed)
Pt calls in stating that she used a new detergent and now feels like her back and arms are on fire. Advised pt  To re-wash clothing in allergen free detergent, use benadryl PO, and to use dermaplast spray topically. Advised pt that if she experiences swelling of the face tongue or lips or SOB to seek emergency care immediately.

## 2014-01-30 ENCOUNTER — Encounter: Payer: Self-pay | Admitting: Internal Medicine

## 2014-01-30 ENCOUNTER — Ambulatory Visit (INDEPENDENT_AMBULATORY_CARE_PROVIDER_SITE_OTHER): Payer: BC Managed Care – PPO | Admitting: Internal Medicine

## 2014-01-30 VITALS — BP 119/84 | HR 85 | Temp 98.2°F | Resp 18 | Wt 292.0 lb

## 2014-01-30 DIAGNOSIS — B86 Scabies: Secondary | ICD-10-CM

## 2014-01-30 MED ORDER — PERMETHRIN 5 % EX CREA: 1.0000 "application " | TOPICAL_CREAM | Freq: Once | CUTANEOUS | Status: DC

## 2014-01-30 NOTE — Progress Notes (Signed)
Subjective:    Patient ID: Morgan Palmer, female    DOB: 1966-04-11, 48 y.o.   MRN: [id number]  HPI  Grand-daughter diagnosed with Scabies.  Pt iching all over back and stomach  Wakes up at night itching  No Known Allergies Past Medical History  Diagnosis Date  . Arthritis   . Cancer     Non Hodgkins Lymphoma   Past Surgical History  Procedure Laterality Date  . Breast surgery      biopsy  . Lymph node biopsy     History   Social History  . Marital Status: Single    Spouse Name: N/A    Number of Children: N/A  . Years of Education: N/A   Occupational History  . Not on file.   Social History Main Topics  . Smoking status: Never Smoker   . Smokeless tobacco: Never Used  . Alcohol Use: Yes  . Drug Use: No  . Sexual Activity: No   Other Topics Concern  . Not on file   Social History Narrative  . No narrative on file   Family History  Problem Relation Age of Onset  . Hypertension Mother   . Diabetes Mother   . Hypertension Father   . Hypertension Brother   . Cancer Maternal Aunt     breast  . Hypertension Maternal Grandmother   . Cancer Maternal Grandfather     prostate  . Cancer Paternal Grandmother     breast   Patient Active Problem List   Diagnosis Date Noted  . 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 Situational stress 12/19/2013  . Morbid obesity 05/15/2013  . Vitamin D deficiency 04/14/2013  . ACUTE BRONCHITIS 01/27/2010  . 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 ANEMIA 06/22/2007  . ALLERGIC RHINITIS 06/22/2007  . HYPERLIPIDEMIA 02/04/2007  . OBESITY NOS 02/04/2007  . HYPERTENSION 02/04/2007  . GERD 02/04/2007  . OSTEOARTHRITIS 02/04/2007   Current Outpatient Prescriptions on File Prior to Visit  Medication Sig Dispense Refill  . ibuprofen (ADVIL,MOTRIN) 800 MG tablet One po bid for 5 days  10 tablet  1  . lansoprazole (PREVACID) 15 MG capsule Take 1 capsule (15 mg total) by mouth  daily.  90 capsule  1  . Multiple Vitamins-Minerals (MULTIPLE VITAMINS/WOMENS PO) Take by mouth 2 (two) times daily.      . nabumetone (RELAFEN) 500 MG tablet Take 1 tablet (500 mg total) by mouth 2 (two) times daily.  60 tablet  0  . triamterene-hydrochlorothiazide (MAXZIDE-25) 37.5-25 MG per tablet TAKE 1 TABLET BY MOUTH EVERY DAY  90 tablet  1   No current facility-administered medications on file prior to visit.       Review of Systems See HPI      Objective:   Physical Exam Physical Exam  Nursing note and vitals reviewed.  Constitutional: She is oriented to person, place, and time. She appears well-developed and well-nourished.  HENT:  Head: Normocephalic and atraumatic.  Cardiovascular: Normal rate and regular rhythm. Exam reveals no gallop and no friction rub.  No murmur heard.  Pulmonary/Chest: Breath sounds normal. She has no wheezes. She has no rales.  Neurological: She is alert and oriented to person, place, and time.  Skin: Skin is warm and dry. Hyperpigmented maculopapular  Nodules back and abd.with liner tracking Psychiatric: She has a normal mood and affect. Her behavior is normal.  Assessment & Plan:  Scabies  Elimite cream neckdown.  Repeat 7 days.  Launder bed linens clothing

## 2014-01-30 NOTE — Patient Instructions (Signed)
Take elimite cream as ordered.  Repeat application in 7 days

## 2014-03-07 ENCOUNTER — Encounter: Payer: Self-pay | Admitting: Hematology & Oncology

## 2014-03-07 ENCOUNTER — Ambulatory Visit (HOSPITAL_BASED_OUTPATIENT_CLINIC_OR_DEPARTMENT_OTHER): Payer: BC Managed Care – PPO | Admitting: Hematology & Oncology

## 2014-03-07 ENCOUNTER — Other Ambulatory Visit (HOSPITAL_BASED_OUTPATIENT_CLINIC_OR_DEPARTMENT_OTHER): Payer: BC Managed Care – PPO | Admitting: Lab

## 2014-03-07 VITALS — BP 118/50 | HR 84 | Temp 98.1°F | Resp 16 | Ht 65.0 in | Wt 292.0 lb

## 2014-03-07 DIAGNOSIS — E039 Hypothyroidism, unspecified: Secondary | ICD-10-CM

## 2014-03-07 DIAGNOSIS — E559 Vitamin D deficiency, unspecified: Secondary | ICD-10-CM

## 2014-03-07 DIAGNOSIS — C8118 Nodular sclerosis classical Hodgkin lymphoma, lymph nodes of multiple sites: Secondary | ICD-10-CM

## 2014-03-07 LAB — CBC WITH DIFFERENTIAL (CANCER CENTER ONLY)
BASO#: 0 10*3/uL (ref 0.0–0.2)
BASO%: 0.2 % (ref 0.0–2.0)
EOS%: 4.6 % (ref 0.0–7.0)
Eosinophils Absolute: 0.3 10*3/uL (ref 0.0–0.5)
HCT: 37.7 % (ref 34.8–46.6)
HEMOGLOBIN: 12.9 g/dL (ref 11.6–15.9)
LYMPH#: 1.4 10*3/uL (ref 0.9–3.3)
LYMPH%: 24.3 % (ref 14.0–48.0)
MCH: 29.7 pg (ref 26.0–34.0)
MCHC: 34.2 g/dL (ref 32.0–36.0)
MCV: 87 fL (ref 81–101)
MONO#: 0.3 10*3/uL (ref 0.1–0.9)
MONO%: 5.7 % (ref 0.0–13.0)
NEUT#: 3.9 10*3/uL (ref 1.5–6.5)
NEUT%: 65.2 % (ref 39.6–80.0)
Platelets: 308 10*3/uL (ref 145–400)
RBC: 4.34 10*6/uL (ref 3.70–5.32)
RDW: 13.5 % (ref 11.1–15.7)
WBC: 5.9 10*3/uL (ref 3.9–10.0)

## 2014-03-07 LAB — COMPREHENSIVE METABOLIC PANEL
ALBUMIN: 4.1 g/dL (ref 3.5–5.2)
ALT: 9 U/L (ref 0–35)
AST: 11 U/L (ref 0–37)
Alkaline Phosphatase: 76 U/L (ref 39–117)
BUN: 14 mg/dL (ref 6–23)
CHLORIDE: 103 meq/L (ref 96–112)
CO2: 25 mEq/L (ref 19–32)
Calcium: 9.4 mg/dL (ref 8.4–10.5)
Creatinine, Ser: 0.74 mg/dL (ref 0.50–1.10)
GLUCOSE: 87 mg/dL (ref 70–99)
Potassium: 3.8 mEq/L (ref 3.5–5.3)
Sodium: 137 mEq/L (ref 135–145)
Total Bilirubin: 0.5 mg/dL (ref 0.2–1.2)
Total Protein: 6.9 g/dL (ref 6.0–8.3)

## 2014-03-07 LAB — TSH CHCC: TSH: 2.705 m(IU)/L (ref 0.308–3.960)

## 2014-03-07 NOTE — Progress Notes (Signed)
Hematology and Oncology Follow Up Visit  Morgan Palmer [id number] 1966/08/23 48 y.o. 03/07/2014   Principle Diagnosis:  Stage IIA nodular sclerosing Hodgkin disease, clinical remission.  Current Therapy:   Observation.     Interim History:  Morgan Palmer is back for followup. Was here every 6 months. Well. She's had no problems since her last saw her. There's been no change in medications.  She's had no problem cough. No shortness of breath. She's had no abdominal pain. There's been no problems with bowels or bladder. She's had no rashes. There's been no leg swelling. She's had no headache.. She is due for a mammogram. She will call to make the appointment.  Medications: Current outpatient prescriptions:ibuprofen (ADVIL,MOTRIN) 800 MG tablet, One po bid for 5 days, Disp: 10 tablet, Rfl: 1;  lansoprazole (PREVACID) 15 MG capsule, Take 1 capsule (15 mg total) by mouth daily., Disp: 90 capsule, Rfl: 1;  Multiple Vitamins-Minerals (MULTIPLE VITAMINS/WOMENS PO), Take by mouth 2 (two) times daily., Disp: , Rfl: ;  nabumetone (RELAFEN) 500 MG tablet, Take 500 mg by mouth 2 (two) times daily as needed., Disp: , Rfl:  permethrin (ELIMITE) 5 % cream, Apply 1 application topically once. Apply to entire body neck down only.  Shower after 8-10 hours.  Repeat application one week, Disp: 60 g, Rfl: 0;  triamterene-hydrochlorothiazide (MAXZIDE-25) 37.5-25 MG per tablet, TAKE 1 TABLET BY MOUTH EVERY DAY, Disp: 90 tablet, Rfl: 1  Allergies: No Known Allergies  Past Medical History, Surgical history, Social history, and Family History were reviewed and updated.  Review of Systems: As above  Physical Exam:  height is 5\' 5"  (1.651 m) and weight is 292 lb (132.45 kg). Her oral temperature is 98.1 F (36.7 C). Her blood pressure is 118/50 and her pulse is 84. Her respiration is 16.   Obese Afro-American female in no obvious distress. Head and neck exam shows no ocular or oral lesions. There are no palpable  cervical or supraclavicular lymph nodes. Lungs are clear. Cardiac exam regular in rhythm with no murmurs rubs or bruits. Abdomen is obese but soft. Has good bowel sounds. There is no fluid wave. There is no palpable liver or spleen tip. Back exam no tenderness over the spine ribs or hips. Extremities shows some slight nonpitting edema of the lower legs. This is chronic. His good range motion of her joints. Neurological exam is nonfocal. Skin exam no rashes ecchymoses or petechia.  Lab Results  Component Value Date   WBC 5.9 03/07/2014   HGB 12.9 03/07/2014   HCT 37.7 03/07/2014   MCV 87 03/07/2014   PLT 308 03/07/2014     Chemistry      Component Value Date/Time   NA 142 09/06/2013 1035   K 4.1 09/06/2013 1035   CL 104 09/06/2013 1035   CO2 27 09/06/2013 1035   BUN 17 09/06/2013 1035   CREATININE 0.71 09/06/2013 1035      Component Value Date/Time   CALCIUM 9.7 09/06/2013 1035   ALKPHOS 81 09/06/2013 1035   AST 13 09/06/2013 1035   ALT 15 09/06/2013 1035   BILITOT 0.4 09/06/2013 1035         Impression and Plan: Morgan Palmer is 48 year old Afro-American female with stage IIA nodular sclerosing Hodgkin's disease. His been 6 years since she completed treatment. She had 4 cycles of ABVD followed by radiation. This was all completed back in May of 2009.  I don't see any evidence of recurrent disease. I do not. see  any issues with respect to hypo-thyroidism. We are checking this whenever we see her.   I want to make sure that she takes vitamin D. I saw her last vitamin D level which was checked back in July of 2014. This was only 18.  We will plan to get her back in 6 months  Volanda Napoleon, MD 6/18/201510:39 AM

## 2014-03-08 ENCOUNTER — Telehealth: Payer: Self-pay | Admitting: *Deleted

## 2014-03-08 NOTE — Telephone Encounter (Addendum)
Message copied by Lenn Sink on Fri Mar 08, 2014 10:23 AM ------      Message from: Burney Gauze R      Created: Thu Mar 07, 2014  1:35 PM       Call - thyroid is ok!!  Morgan Palmer ------Informed pt that thyroid is okay.

## 2014-05-22 ENCOUNTER — Encounter: Payer: BC Managed Care – PPO | Admitting: Internal Medicine

## 2014-06-06 ENCOUNTER — Ambulatory Visit (INDEPENDENT_AMBULATORY_CARE_PROVIDER_SITE_OTHER): Payer: BC Managed Care – PPO | Admitting: Internal Medicine

## 2014-06-06 ENCOUNTER — Encounter: Payer: Self-pay | Admitting: *Deleted

## 2014-06-06 ENCOUNTER — Encounter: Payer: Self-pay | Admitting: Internal Medicine

## 2014-06-06 VITALS — BP 116/84 | HR 104 | Temp 97.6°F | Resp 16 | Ht 65.0 in | Wt 296.0 lb

## 2014-06-06 DIAGNOSIS — M62838 Other muscle spasm: Secondary | ICD-10-CM

## 2014-06-06 DIAGNOSIS — Z23 Encounter for immunization: Secondary | ICD-10-CM

## 2014-06-06 MED ORDER — CYCLOBENZAPRINE HCL 10 MG PO TABS: ORAL_TABLET | ORAL | Status: DC

## 2014-06-06 NOTE — Patient Instructions (Signed)
See me as needed 

## 2014-06-06 NOTE — Progress Notes (Signed)
Subjective:    Patient ID: Morgan Palmer, female    DOB: 04/18/1966, 48 y.o.   MRN: [id number]  HPI  Morgan Palmer is here for acute visit  She tells me she thinks she pulled a muscle in her left thigh when bending over and trying to lift something at work.  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  She is also teary today as she lost her father a few months ago and was his caretaker up to the end .  Hospice was involved.   She tells me she is adjusting OK  Able to concentrate at work  No Known Allergies Past Medical History  Diagnosis Date  . Arthritis   . Cancer     Non Hodgkins Lymphoma   Past Surgical History  Procedure Laterality Date  . Breast surgery      biopsy  . Lymph node biopsy     History   Social History  . Marital Status: Single    Spouse Name: N/A    Number of Children: N/A  . Years of Education: N/A   Occupational History  . Not on file.   Social History Main Topics  . Smoking status: Never Smoker   . Smokeless tobacco: Never Used     Comment: never used tobacco  . Alcohol Use: Yes  . Drug Use: No  . Sexual Activity: No   Other Topics Concern  . Not on file   Social History Narrative  . No narrative on file   Family History  Problem Relation Age of Onset  . Hypertension Mother   . Diabetes Mother   . Hypertension Father   . Hypertension Brother   . Cancer Maternal Aunt     breast  . Hypertension Maternal Grandmother   . Cancer Maternal Grandfather     prostate  . Cancer Paternal Grandmother     breast   Patient Active Problem List   Diagnosis Date Noted  . 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 Situational stress 12/19/2013  . Morbid obesity 05/15/2013  . Vitamin D deficiency 04/14/2013  . ACUTE BRONCHITIS 01/27/2010  . 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 ANEMIA 06/22/2007  . ALLERGIC RHINITIS 06/22/2007  . HYPERLIPIDEMIA 02/04/2007  . OBESITY NOS 02/04/2007  . HYPERTENSION  02/04/2007  . GERD 02/04/2007  . OSTEOARTHRITIS 02/04/2007   Current Outpatient Prescriptions on File Prior to Visit  Medication Sig Dispense Refill  . ibuprofen (ADVIL,MOTRIN) 800 MG tablet One po bid for 5 days  10 tablet  1  . lansoprazole (PREVACID) 15 MG capsule Take 1 capsule (15 mg total) by mouth daily.  90 capsule  1  . Multiple Vitamins-Minerals (MULTIPLE VITAMINS/WOMENS PO) Take by mouth 2 (two) times daily.      . nabumetone (RELAFEN) 500 MG tablet Take 500 mg by mouth 2 (two) times daily as needed.      . triamterene-hydrochlorothiazide (MAXZIDE-25) 37.5-25 MG per tablet TAKE 1 TABLET BY MOUTH EVERY DAY  90 tablet  1   No current facility-administered medications on file prior to visit.      Review of Systems See HPI     Objective:   Physical Exam  Physical Exam  Nursing note and vitals reviewed.  Constitutional: She is oriented to person, place, and time. She appears well-developed and well-nourished.  HENT:  Head: Normocephalic and atraumatic.  Cardiovascular: Normal rate and regular rhythm. Exam reveals no gallop and no friction rub.  No murmur heard.  Pulmonary/Chest: Breath sounds normal. She has no wheezes. She has no rales.  Neurological: She is alert and oriented to person, place, and time.  Skin: Skin is warm and dry.  M/S  Pain with any movement of left posterior hamstring.  No pain in either leg with any other muscle testing Ext no edema  Psychiatric: She has a normal mood and affect. Her behavior is normal.             Assessment & Plan:  Left hamstring muscle spasm   Ok to give Flexeril at night and 1/2 during day   Given note to not lift over 10 lbs at work   [employer]  Adjusting fine  Will give flu vaccine today  See me as needed

## 2014-06-12 ENCOUNTER — Ambulatory Visit (INDEPENDENT_AMBULATORY_CARE_PROVIDER_SITE_OTHER): Payer: BC Managed Care – PPO | Admitting: Medical

## 2014-06-12 ENCOUNTER — Encounter: Payer: Self-pay | Admitting: Medical

## 2014-06-12 VITALS — BP 125/93 | HR 112 | Temp 97.6°F | Ht 64.75 in | Wt 299.2 lb

## 2014-06-12 DIAGNOSIS — S76312A Strain of muscle, fascia and tendon of the posterior muscle group at thigh level, left thigh, initial encounter: Secondary | ICD-10-CM | POA: Insufficient documentation

## 2014-06-12 DIAGNOSIS — IMO0002 Reserved for concepts with insufficient information to code with codable children: Secondary | ICD-10-CM

## 2014-06-12 MED ORDER — DICLOFENAC SODIUM 75 MG PO TBEC: 75.0000 mg | DELAYED_RELEASE_TABLET | Freq: Two times a day (BID) | ORAL | Status: DC

## 2014-06-12 NOTE — Patient Instructions (Signed)
You appear to have hamstring strain. I am prescribing diclofenac for inflammation. Stop otc nsaids including ibuprofen. I will prescribe more diclofenac but will give lower dose. I am giving you tramadol for pain. Follow up in 2 wks or as needed.  Hamstring Strain  Hamstrings are the large muscles in the back of the thighs. A strain or tear injury happens when there is a sudden stretch or pull on these muscles and tendons. Tendons are cord like structures that attach muscle to bone. These injuries are commonly seen in activities such as sprinting due to sudden acceleration.  DIAGNOSIS  Often the diagnosis can be made by examination. HOME CARE INSTRUCTIONS   Apply ice to the sore area for 15-58minutes, 03-04 times per day. Do this while awake for the first 2 days. Put the ice in a plastic bag, and place a towel between the bag of ice and your skin.  Keep your knee flexed when possible. This means your foot is held off the ground slightly if you are on crutches. When lying down, a pillow under the knee will take strain off the muscles and provide some relief.  If a compression bandage such as an ace wrap was applied, use it until you are seen again. You may remove it for sleeping, showers and baths. If the wrap seems to be too tight and is uncomfortable, wrap it more loosely. If your toes or foot are getting cold or blue, it is too tight.  Walk or move around as the pain allows, or as instructed. Resume full activities as suggested by your caregiver. This is often safest when the strength of the injured leg has nearly returned to normal.  Only take over-the-counter or prescription medicines for pain, discomfort, or fever as directed by your caregiver. SEEK MEDICAL CARE IF:   You have an increase in bruising, swelling or pain.  You notice coldness or blueness of your toes or foot.  Pain relief is not obtained with medications.  You have increasing pain in the area and seem to be getting worse  rather than better.  You notice your thigh getting larger in size (this could indicate bleeding into the muscle). Document Released: 06/01/2001 Document Revised: 11/29/2011 Document Reviewed: 09/08/2008 [facility] Patient Information 2015 [place], [place]. This information is not intended to replace advice given to you by your health care provider. Make sure you discuss any questions you have with your health care provider.

## 2014-06-12 NOTE — Assessment & Plan Note (Signed)
I did discuss with patient nature of hamstring strain and duration of healing usually can be prolonged. Rx diclofenac, flexeril low dose, and tramadol. Follow up in 2 wks. If she wants to start exercise program advised very slow and gradual entry. Start with walking when no pain during daily activities  then advance incrementally. May refer to  PT or ortho if pain not improving at all.

## 2014-06-12 NOTE — Progress Notes (Signed)
Subjective:    Patient ID: Morgan Palmer, female    DOB: May 17, 1966, 48 y.o.   MRN: [id number]  HPI  Pt in states she has some Lt hamstring pain for 2 wks. 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 She remembers about the time of the pain was doing some transfers. She states woke up and had felt mild pain 2 wks ago.(some increase pain since 2 wks). Present at rest mild pain but increase with activity and when stretches hamstring. Some spasms in that area yesterday. Pt was given 10 mg tabs. Told to take 1/2 tab po bid. But taking 1 tab po bid. Pt is on ibuprofen 800 mg bid. No history of kidney problems. Pain level is about a 7/10.   Past Medical History  Diagnosis Date  . Arthritis   . Cancer     Non Hodgkins Lymphoma    History   Social History  . Marital Status: Single    Spouse Name: N/A    Number of Children: N/A  . Years of Education: N/A   Occupational History  . Not on file.   Social History Main Topics  . Smoking status: Never Smoker   . Smokeless tobacco: Never Used     Comment: never used tobacco  . Alcohol Use: Yes  . Drug Use: No  . Sexual Activity: No   Other Topics Concern  . Not on file   Social History Narrative  . No narrative on file    Past Surgical History  Procedure Laterality Date  . Breast surgery      biopsy  . Lymph node biopsy      Family History  Problem Relation Age of Onset  . Hypertension Mother   . Diabetes Mother   . Hypertension Father   . Hypertension Brother   . Cancer Maternal Aunt     breast  . Hypertension Maternal Grandmother   . Cancer Maternal Grandfather     prostate  . Cancer Paternal Grandmother     breast    No Known Allergies  Current Outpatient Prescriptions on File Prior to Visit  Medication Sig Dispense Refill  . cyclobenzaprine (FLEXERIL) 10 MG tablet Take one tablet at hs for 7 days and 1/2 during daytime prn  30 tablet  0  . ibuprofen (ADVIL,MOTRIN) 800 MG tablet One po bid for 5 days  10 tablet  1  .  lansoprazole (PREVACID) 15 MG capsule Take 1 capsule (15 mg total) by mouth daily.  90 capsule  1  . Multiple Vitamins-Minerals (MULTIPLE VITAMINS/WOMENS PO) Take by mouth 2 (two) times daily.      . nabumetone (RELAFEN) 500 MG tablet Take 500 mg by mouth 2 (two) times daily as needed.      . triamterene-hydrochlorothiazide (MAXZIDE-25) 37.5-25 MG per tablet TAKE 1 TABLET BY MOUTH EVERY DAY  90 tablet  1   No current facility-administered medications on file prior to visit.    BP 125/93  Pulse 112  Temp(Src) 97.6 F (36.4 C) (Oral)  Ht 5' 4.75" (1.645 m)  Wt 299 lb 3.2 oz (135.716 kg)  BMI 50.15 kg/m2  SpO2 100%  LMP 05/15/2014      Review of Systems  Constitutional: Negative for fever, chills and fatigue.  Respiratory: Negative for cough, chest tightness and wheezing.   Cardiovascular: Negative for chest pain and palpitations.  Gastrointestinal: Negative for nausea, vomiting, abdominal pain, constipation, blood in stool and abdominal distention.  Musculoskeletal:  Lt hamstring pain. No popliteal pain.  Neurological: Negative for weakness and numbness.       Of leg.  Hematological: Negative for adenopathy. Does not bruise/bleed easily.       Objective:   Physical Exam  General- No acute distress, pleasant. Lt leg- neg homans signs. No edema. Pain on palpation of left hamstring and strain. When hamstring stretched pain is increasesd       Assessment & Plan:

## 2014-07-16 ENCOUNTER — Telehealth: Payer: Self-pay

## 2014-07-16 NOTE — Telephone Encounter (Signed)
Would you like me to order an x-ray for this patient? - eh

## 2014-07-16 NOTE — Telephone Encounter (Signed)
Morgan Palmer [phone]  [name] called and said she was not getting better and thinks she needs a xray of her left thigh - hamstring area. Muscle relaxer's have not helped

## 2014-07-16 NOTE — Telephone Encounter (Signed)
I called patient and set her up to come in tomorrow @ 9:15-eh

## 2014-07-16 NOTE — Telephone Encounter (Signed)
Have pt see me in am at 9:15 or 11:15

## 2014-07-17 ENCOUNTER — Encounter: Payer: Self-pay | Admitting: Internal Medicine

## 2014-07-17 ENCOUNTER — Ambulatory Visit (INDEPENDENT_AMBULATORY_CARE_PROVIDER_SITE_OTHER): Payer: Self-pay | Admitting: Internal Medicine

## 2014-07-17 ENCOUNTER — Ambulatory Visit (HOSPITAL_BASED_OUTPATIENT_CLINIC_OR_DEPARTMENT_OTHER)
Admission: RE | Admit: 2014-07-17 | Discharge: 2014-07-17 | Disposition: A | Discharge status: Home / Self Care | Payer: Self-pay | Source: Ambulatory Visit | Attending: Internal Medicine | Admitting: Internal Medicine

## 2014-07-17 VITALS — BP 129/90 | HR 101 | Temp 98.2°F | Resp 16 | Ht 66.0 in | Wt 297.0 lb

## 2014-07-17 DIAGNOSIS — M79662 Pain in left lower leg: Secondary | ICD-10-CM

## 2014-07-17 DIAGNOSIS — M79652 Pain in left thigh: Secondary | ICD-10-CM | POA: Insufficient documentation

## 2014-07-17 DIAGNOSIS — T148XXA Other injury of unspecified body region, initial encounter: Secondary | ICD-10-CM

## 2014-07-17 NOTE — Progress Notes (Signed)
Subjective:    Patient ID: Morgan Palmer, female    DOB: October 09, 1965, 48 y.o.   MRN: [id number]  HPI 06/12/2014 PA note Left hamstring muscle strain - Meriam Sprague Saguier, PA-C at 06/12/2014 8:53 AM    Status: Written Related Problem: Left hamstring muscle strain   I did discuss with patient nature of hamstring strain and duration of healing usually can be prolonged. Rx diclofenac, flexeril low dose, and tramadol. Follow up in 2 wks. If she wants to start exercise program advised very slow and gradual entry. Start with walking when no pain during daily activities then advance incrementally. May refer to PT or ortho if pain not improving at all.            Today   Morgan Palmer has persistant pain in calf and in left thigh  Edema off and on.  Mother has h/o DVT  No Known Allergies Past Medical History  Diagnosis Date  . Arthritis   . Cancer     Non Hodgkins Lymphoma   Past Surgical History  Procedure Laterality Date  . Breast surgery      biopsy  . Lymph node biopsy     History   Social History  . Marital Status: Single    Spouse Name: N/A    Number of Children: N/A  . Years of Education: N/A   Occupational History  . Not on file.   Social History Main Topics  . Smoking status: Never Smoker   . Smokeless tobacco: Never Used     Comment: never used tobacco  . Alcohol Use: Yes  . Drug Use: No  . Sexual Activity: No   Other Topics Concern  . Not on file   Social History Narrative  . No narrative on file   Family History  Problem Relation Age of Onset  . Hypertension Mother   . Diabetes Mother   . Hypertension Father   . Hypertension Brother   . Cancer Maternal Aunt     breast  . Hypertension Maternal Grandmother   . Cancer Maternal Grandfather     prostate  . Cancer Paternal Grandmother     breast   Patient Active Problem List   Diagnosis Date Noted  . Left hamstring muscle strain 06/12/2014  . DJD (degenerative joint disease)  advanced right knee  01/08/2014  . Situational stress 12/19/2013  . Morbid obesity 05/15/2013  . Vitamin D deficiency 04/14/2013  . ACUTE BRONCHITIS 01/27/2010  . 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 ANEMIA 06/22/2007  . ALLERGIC RHINITIS 06/22/2007  . HYPERLIPIDEMIA 02/04/2007  . OBESITY NOS 02/04/2007  . HYPERTENSION 02/04/2007  . GERD 02/04/2007  . OSTEOARTHRITIS 02/04/2007   Current Outpatient Prescriptions on File Prior to Visit  Medication Sig Dispense Refill  . cyclobenzaprine (FLEXERIL) 10 MG tablet Take one tablet at hs for 7 days and 1/2 during daytime prn  30 tablet  0  . diclofenac (VOLTAREN) 75 MG EC tablet Take 1 tablet (75 mg total) by mouth 2 (two) times daily.  30 tablet  0  . ibuprofen (ADVIL,MOTRIN) 800 MG tablet One po bid for 5 days  10 tablet  1  . lansoprazole (PREVACID) 15 MG capsule Take 1 capsule (15 mg total) by mouth daily.  90 capsule  1  . Multiple Vitamins-Minerals (MULTIPLE VITAMINS/WOMENS PO) Take by mouth 2 (two) times daily.      . nabumetone (RELAFEN) 500 MG tablet Take 500 mg by mouth 2 (two) times daily  as needed.      . triamterene-hydrochlorothiazide (MAXZIDE-25) 37.5-25 MG per tablet TAKE 1 TABLET BY MOUTH EVERY DAY  90 tablet  1   No current facility-administered medications on file prior to visit.       Review of Systems See HPI    Objective:   Physical Exam Physical Exam  Nursing note and vitals reviewed.  Constitutional: She is oriented to person, place, and time. She appears well-developed and well-nourished.  HENT:  Head: Normocephalic and atraumatic.  Cardiovascular: Normal rate and regular rhythm. Exam reveals no gallop and no friction rub.  No murmur heard.  Pulmonary/Chest: Breath sounds normal. She has no wheezes. She has no rales.  Neurological: She is alert and oriented to person, place, and time.  Skin: Skin is warm and dry.  LLE  Good pedal pulse  Homans sign neg.    Slight edema   Pain palpation left calf and  upper thigh  Psychiatric: She has a normal mood and affect. Her behavior is normal.              Assessment & Plan:  Calf pain persistant  Will get U/S to rule out DVT    U/S neg DVT  Muscle   Strain  Ok for OTC Velcro support  .  Ibuprofen 800 mg bid  Prn

## 2014-07-22 ENCOUNTER — Encounter: Payer: Self-pay | Admitting: Internal Medicine

## 2014-08-05 ENCOUNTER — Emergency Department (HOSPITAL_COMMUNITY)
Admission: EM | Admit: 2014-08-05 | Discharge: 2014-08-05 | Disposition: A | Discharge status: Home / Self Care | Payer: Self-pay | Attending: Physician Assistant | Admitting: Physician Assistant

## 2014-08-05 ENCOUNTER — Encounter (HOSPITAL_COMMUNITY): Payer: Self-pay | Admitting: Emergency Medicine

## 2014-08-05 DIAGNOSIS — G8929 Other chronic pain: Secondary | ICD-10-CM | POA: Insufficient documentation

## 2014-08-05 DIAGNOSIS — M79609 Pain in unspecified limb: Secondary | ICD-10-CM

## 2014-08-05 DIAGNOSIS — M1990 Unspecified osteoarthritis, unspecified site: Secondary | ICD-10-CM | POA: Insufficient documentation

## 2014-08-05 DIAGNOSIS — Z8572 Personal history of non-Hodgkin lymphomas: Secondary | ICD-10-CM | POA: Insufficient documentation

## 2014-08-05 DIAGNOSIS — Z79899 Other long term (current) drug therapy: Secondary | ICD-10-CM | POA: Insufficient documentation

## 2014-08-05 DIAGNOSIS — Z791 Long term (current) use of non-steroidal anti-inflammatories (NSAID): Secondary | ICD-10-CM | POA: Insufficient documentation

## 2014-08-05 DIAGNOSIS — M79605 Pain in left leg: Secondary | ICD-10-CM

## 2014-08-05 DIAGNOSIS — M79652 Pain in left thigh: Secondary | ICD-10-CM | POA: Insufficient documentation

## 2014-08-05 DIAGNOSIS — M25562 Pain in left knee: Secondary | ICD-10-CM | POA: Insufficient documentation

## 2014-08-05 HISTORY — DX: Other chronic pain: G89.29

## 2014-08-05 HISTORY — DX: Pain in leg, unspecified: M79.606

## 2014-08-05 MED ORDER — OXYCODONE-ACETAMINOPHEN 5-325 MG PO TABS: 1.0000 | ORAL_TABLET | Freq: Once | ORAL | Status: DC

## 2014-08-05 MED ORDER — TRAMADOL HCL 50 MG PO TABS
50.0000 mg | ORAL_TABLET | Freq: Once | ORAL | Status: AC
  Administered 2014-08-05: 50 mg via ORAL
  Filled 2014-08-05: qty 1

## 2014-08-05 MED ORDER — TRAMADOL HCL 50 MG PO TABS: 50.0000 mg | ORAL_TABLET | Freq: Four times a day (QID) | ORAL | Status: DC | PRN

## 2014-08-05 MED ORDER — METHOCARBAMOL 500 MG PO TABS
500.0000 mg | ORAL_TABLET | Freq: Once | ORAL | Status: AC
  Administered 2014-08-05: 500 mg via ORAL
  Filled 2014-08-05: qty 1

## 2014-08-05 NOTE — ED Provider Notes (Signed)
CSN: [id number]     Arrival date & time 08/05/14  0443 History   First MD Initiated Contact with Patient 08/05/14 [phone]     Chief Complaint  Patient presents with  . Leg Pain     (Consider location/radiation/quality/duration/timing/severity/associated sxs/prior Treatment) HPI  Pt is a 48yo morbidly obese female with hx of arthritis and chronic left leg pain presenting to ED with c/o posterior left leg pain x1 month. 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 Since then, she has tried ibuprofen and flexeril w/o relief.  Pt states pain is constant, 10/10 at this time, sharp and shooting in nature.  Pain radiates into the back of her knee and occasionally down into her calf. She reports having a venous ultrasound down about 2 weeks ago by her PCP and no blood clot was found. Pt was advised to continue to take ibuprofen. Pt states in the meantime she has been resting as well as going to the gym at times but pain has not eased off.  Denies previous hx of blood clots.   Past Medical History  Diagnosis Date  . Arthritis   . Cancer     Non Hodgkins Lymphoma  . Chronic leg pain     left   Past Surgical History  Procedure Laterality Date  . Breast surgery      biopsy  . Lymph node biopsy     Family History  Problem Relation Age of Onset  . Hypertension Mother   . Diabetes Mother   . Deep vein thrombosis Mother   . Hypertension Father   . Hypertension Brother   . Cancer Maternal Aunt     breast  . Hypertension Maternal Grandmother   . Cancer Maternal Grandfather     prostate  . Cancer Paternal Grandmother     breast   History  Substance Use Topics  . Smoking status: Never Smoker   . Smokeless tobacco: Never Used     Comment: never used tobacco  . Alcohol Use: Yes   OB History    Gravida Para Term Preterm AB TAB SAB Ectopic Multiple Living   3 2   1  1   2      Review of Systems  Constitutional: Negative for fever  and chills.  Musculoskeletal: Positive for myalgias. Negative for back pain, joint swelling, arthralgias, gait problem, neck pain and neck stiffness.  Skin: Negative for color change and wound.  Neurological: Negative for weakness and numbness.  All other systems reviewed and are negative.     Allergies  Review of patient's allergies indicates no known allergies.  Home Medications   Prior to Admission medications   Medication Sig Start Date End Date Taking? Authorizing Provider  cyclobenzaprine (FLEXERIL) 10 MG tablet Take one tablet at hs for 7 days and 1/2 during daytime prn 06/06/14   Lanice Shirts, MD  diclofenac (VOLTAREN) 75 MG EC tablet Take 1 tablet (75 mg total) by mouth 2 (two) times daily. 06/12/14   Meriam Sprague Saguier, PA-C  ibuprofen (ADVIL,MOTRIN) 800 MG tablet One po bid for 5 days    Lanice Shirts, MD  lansoprazole (PREVACID) 15 MG capsule Take 1 capsule (15 mg total) by mouth daily.    Lanice Shirts, MD  Multiple Vitamins-Minerals (MULTIPLE VITAMINS/WOMENS PO) Take by mouth 2 (two) times daily.    Historical Provider, MD  nabumetone (RELAFEN) 500 MG tablet Take 500  mg by mouth 2 (two) times daily as needed. 01/08/14   Lanice Shirts, MD  traMADol (ULTRAM) 50 MG tablet Take 1 tablet (50 mg total) by mouth every 6 (six) hours as needed. 08/05/14   Noland Fordyce, PA-C  triamterene-hydrochlorothiazide (MAXZIDE-25) 37.5-25 MG per tablet TAKE 1 TABLET BY MOUTH EVERY DAY    Lanice Shirts, MD   BP 100/79 mmHg  Pulse 97  Resp 18  SpO2 90%  LMP 07/20/2014 Physical Exam  Constitutional: She is oriented to person, place, and time. She appears well-developed and well-nourished.  Morbidly obese female lying on exam bed, NAD.  HENT:  Head: Normocephalic and atraumatic.  Eyes: EOM are normal.  Neck: Normal range of motion.  Cardiovascular: Normal rate.   Pulses:      Dorsalis pedis pulses are 2+ on the left side.  Pulmonary/Chest: Effort normal.   Musculoskeletal: Normal range of motion. She exhibits tenderness. She exhibits no edema.  No midline spinal tenderness. No tenderness to back musculature or buttock. Tenderness to posteriors aspect of left thigh down to posterior aspect of left knee. FROM left hip and knee. No calf tenderness, erythema or warmth.   Neurological: She is alert and oriented to person, place, and time.  Sensation in tact to left leg.  Skin: Skin is warm and dry.  Skin in tact. No ecchymosis, erythema, or warmth. No red streaking, induration, or evidence of underlying infection  Psychiatric: She has a normal mood and affect. Her behavior is normal.  Nursing note and vitals reviewed.   ED Course  Procedures (including critical care time) Labs Review Labs Reviewed - No data to display  Imaging Review No results found.   EKG Interpretation None      MDM   Final diagnoses:  Leg pain, posterior, left    Pt c/o left posterior thigh pain x1 month. Reports pulling a muscle about 1 month ago. tx with ibuprofen and flexeril w/o relief. Denies falls or other injuries. Left leg is neurovascularly in tact. On exam, pt is tender to posterior left thigh, no tenderness of buttock or in back.  Venous doppler today: negative for DVT. Will discharge home with tramadol. Advised to f/u with PCP, may need referral to PT and/or orthopedics if symptoms not improving in 1 week. Home care instructions provided.  Pt verbalized understanding and agreement with tx plan.    Noland Fordyce, PA-C 08/05/14 [place], DO 08/07/14 [phone]

## 2014-08-05 NOTE — ED Notes (Signed)
Pt leaving for ultrasound.  

## 2014-08-05 NOTE — Progress Notes (Signed)
*  PRELIMINARY RESULTS* Vascular Ultrasound Left lower extremity venous duplex has been completed.  Preliminary findings: no evidence of DVT.  Landry Mellow, RDMS, RVT  08/05/2014, 8:06 AM

## 2014-08-05 NOTE — ED Notes (Signed)
Pt reports pulling a muscle to back of left leg over a month ago. Has been to pcp and no relief with muscle relaxers since. Pt reports being in constant pain, from back of left thigh and radiates down her leg. Denies any back pain. Already had doppler study done, negative for blood clots.

## 2014-08-09 ENCOUNTER — Other Ambulatory Visit: Payer: Self-pay | Admitting: Internal Medicine

## 2014-08-09 NOTE — Telephone Encounter (Signed)
Refill request

## 2014-09-04 ENCOUNTER — Telehealth: Payer: Self-pay | Admitting: Hematology & Oncology

## 2014-09-04 NOTE — Telephone Encounter (Signed)
Pt left message to cx 12-17 and reschedule end of January or February. I left her message 12-17 cx and to call back to reschedule

## 2014-09-05 ENCOUNTER — Other Ambulatory Visit: Payer: BC Managed Care – PPO | Admitting: Lab

## 2014-09-05 ENCOUNTER — Ambulatory Visit: Payer: BC Managed Care – PPO | Admitting: Hematology & Oncology

## 2014-10-22 ENCOUNTER — Ambulatory Visit: Payer: Self-pay | Admitting: Family

## 2014-10-22 ENCOUNTER — Other Ambulatory Visit: Payer: Self-pay | Admitting: Lab

## 2014-10-23 ENCOUNTER — Telehealth: Payer: Self-pay | Admitting: Hematology & Oncology

## 2014-10-23 NOTE — Telephone Encounter (Signed)
Pt scheduled 3-14 from 2-1 no show said she was under a lot of pressure from when her Daddy died and had forgot everything. She said her new insurance starts in march.

## 2014-10-31 ENCOUNTER — Encounter: Payer: BC Managed Care – PPO | Admitting: Internal Medicine

## 2014-11-22 ENCOUNTER — Other Ambulatory Visit: Payer: Self-pay | Admitting: Internal Medicine

## 2014-11-25 NOTE — Telephone Encounter (Signed)
Refill request

## 2014-12-02 ENCOUNTER — Other Ambulatory Visit (HOSPITAL_BASED_OUTPATIENT_CLINIC_OR_DEPARTMENT_OTHER): Payer: No Typology Code available for payment source | Admitting: Lab

## 2014-12-02 ENCOUNTER — Ambulatory Visit (HOSPITAL_BASED_OUTPATIENT_CLINIC_OR_DEPARTMENT_OTHER): Payer: No Typology Code available for payment source | Admitting: Hematology & Oncology

## 2014-12-02 ENCOUNTER — Encounter: Payer: Self-pay | Admitting: Hematology & Oncology

## 2014-12-02 VITALS — BP 142/88 | HR 79 | Temp 97.7°F | Resp 16 | Ht 66.0 in | Wt 298.0 lb

## 2014-12-02 DIAGNOSIS — Z8571 Personal history of Hodgkin lymphoma: Secondary | ICD-10-CM

## 2014-12-02 DIAGNOSIS — C8118 Nodular sclerosis classical Hodgkin lymphoma, lymph nodes of multiple sites: Secondary | ICD-10-CM

## 2014-12-02 DIAGNOSIS — E039 Hypothyroidism, unspecified: Secondary | ICD-10-CM

## 2014-12-02 DIAGNOSIS — E559 Vitamin D deficiency, unspecified: Secondary | ICD-10-CM

## 2014-12-02 DIAGNOSIS — E669 Obesity, unspecified: Secondary | ICD-10-CM

## 2014-12-02 LAB — COMPREHENSIVE METABOLIC PANEL (CC13)
ALT: 18 U/L (ref 0–55)
ANION GAP: 8 meq/L (ref 3–11)
AST: 15 U/L (ref 5–34)
Albumin: 3.5 g/dL (ref 3.5–5.0)
Alkaline Phosphatase: 75 U/L (ref 40–150)
BUN: 13.2 mg/dL (ref 7.0–26.0)
CO2: 22 mEq/L (ref 22–29)
CREATININE: 0.7 mg/dL (ref 0.6–1.1)
Calcium: 9.1 mg/dL (ref 8.4–10.4)
Chloride: 112 mEq/L — ABNORMAL HIGH (ref 98–109)
EGFR: >90 mL/min/{1.73_m2} (ref >90)
Glucose: 87 mg/dl (ref 70–140)
Potassium: 4.1 mEq/L (ref 3.5–5.1)
Sodium: 142 mEq/L (ref 136–145)
Total Bilirubin: 0.45 mg/dL (ref 0.20–1.20)
Total Protein: 6.9 g/dL (ref 6.4–8.3)

## 2014-12-02 LAB — CBC WITH DIFFERENTIAL (CANCER CENTER ONLY)
BASO#: 0 10*3/uL (ref 0.0–0.2)
BASO%: 0.3 % (ref 0.0–2.0)
EOS ABS: 0.2 10*3/uL (ref 0.0–0.5)
EOS%: 3.4 % (ref 0.0–7.0)
HCT: 36.2 % (ref 34.8–46.6)
HGB: 12.2 g/dL (ref 11.6–15.9)
LYMPH#: 2 10*3/uL (ref 0.9–3.3)
LYMPH%: 29 % (ref 14.0–48.0)
MCH: 29 pg (ref 26.0–34.0)
MCHC: 33.7 g/dL (ref 32.0–36.0)
MCV: 86 fL (ref 81–101)
MONO#: 0.4 10*3/uL (ref 0.1–0.9)
MONO%: 6.2 % (ref 0.0–13.0)
NEUT%: 61.1 % (ref 39.6–80.0)
NEUTROS ABS: 4.2 10*3/uL (ref 1.5–6.5)
PLATELETS: 302 10*3/uL (ref 145–400)
RBC: 4.21 10*6/uL (ref 3.70–5.32)
RDW: 13.7 % (ref 11.1–15.7)
WBC: 6.8 10*3/uL (ref 3.9–10.0)

## 2014-12-02 LAB — TSH CHCC: TSH: 1.416 m(IU)/L (ref 0.308–3.960)

## 2014-12-02 NOTE — Progress Notes (Signed)
Hematology and Oncology Follow Up Visit  Morgan Palmer [id number] 1965/12/17 49 y.o. 12/02/2014   Principle Diagnosis:  Stage IIA nodular sclerosing Hodgkin disease, clinical remission.  Current Therapy:   Observation.     Interim History:  Ms.  Palmer is back for followup. We last saw her back in June 2015.  She's had no problem since we saw her. There is no cough.. No shortness of breath. She's had no abdominal pain. There's been no problems with bowels or bladder. She's had no rashes. There's been no leg swelling. She's had no headache.. \  She was supposed to have a mammogram last year. However, she did not have this done. Her father passed away. This is still affecting her. She really has had a tough time with his passing. I told her that she should not feel responsible. He was just chronically ill and that she cannot be there all the time to try to help.  We had to make sure that she gets a mammogram set up.    Medications:  Current outpatient prescriptions:  .  ibuprofen (ADVIL,MOTRIN) 800 MG tablet, One po bid for 5 days, Disp: 10 tablet, Rfl: 1 .  lansoprazole (PREVACID) 15 MG capsule, TAKE ONE CAPSULE BY MOUTH EVERY DAY, Disp: 90 capsule, Rfl: 1 .  Multiple Vitamins-Minerals (MULTIPLE VITAMINS/WOMENS PO), Take by mouth 2 (two) times daily., Disp: , Rfl:  .  triamterene-hydrochlorothiazide (MAXZIDE-25) 37.5-25 MG per tablet, TAKE 1 TABLET BY MOUTH EVERY DAY, Disp: 90 tablet, Rfl: 1 .  cyclobenzaprine (FLEXERIL) 10 MG tablet, Take one tablet at hs for 7 days and 1/2 during daytime prn (Patient not taking: Reported on 12/02/2014), Disp: 30 tablet, Rfl: 0 .  diclofenac (VOLTAREN) 75 MG EC tablet, Take 1 tablet (75 mg total) by mouth 2 (two) times daily. (Patient not taking: Reported on 12/02/2014), Disp: 30 tablet, Rfl: 0 .  nabumetone (RELAFEN) 500 MG tablet, Take 500 mg by mouth 2 (two) times daily as needed., Disp: , Rfl:  .  traMADol (ULTRAM) 50 MG tablet, Take 1 tablet (50 mg  total) by mouth every 6 (six) hours as needed. (Patient not taking: Reported on 12/02/2014), Disp: 15 tablet, Rfl: 0  Allergies: No Known Allergies  Past Medical History, Surgical history, Social history, and Family History were reviewed and updated.  Review of Systems: As above  Physical Exam:  height is 5\' 6"  (1.676 m) and weight is 298 lb (135.172 kg). Her oral temperature is 97.7 F (36.5 C). Her blood pressure is 142/88 and her pulse is 79. Her respiration is 16.   Obese Afro-American female in no obvious distress. Head and neck exam shows no ocular or oral lesions. There are no palpable cervical or supraclavicular lymph nodes. Lungs are clear. Cardiac exam regular rate and rhythm with no murmurs rubs or bruits. Abdomen is obese but soft. She has good bowel sounds. There is no fluid wave. There is no palpable liver or spleen tip. Back exam with no tenderness over the spine ribs or hips. Extremities shows some slight  nonpitting edema of the lower legs. This is chronic. His good range motion of her joints. Neurological exam is nonfocal. Skin exam no rashes ecchymoses or petechia.  Lab Results  Component Value Date   WBC 6.8 12/02/2014   HGB 12.2 12/02/2014   HCT 36.2 12/02/2014   MCV 86 12/02/2014   PLT 302 12/02/2014     Chemistry      Component Value Date/Time   NA 142  12/02/2014 1148   NA 137 03/07/2014 0915   K 4.1 12/02/2014 1148   K 3.8 03/07/2014 0915   CL 103 03/07/2014 0915   CO2 22 12/02/2014 1148   CO2 25 03/07/2014 0915   BUN 13.2 12/02/2014 1148   BUN 14 03/07/2014 0915   CREATININE 0.7 12/02/2014 1148   CREATININE 0.74 03/07/2014 0915      Component Value Date/Time   CALCIUM 9.1 12/02/2014 1148   CALCIUM 9.4 03/07/2014 0915   ALKPHOS 75 12/02/2014 1148   ALKPHOS 76 03/07/2014 0915   AST 15 12/02/2014 1148   AST 11 03/07/2014 0915   ALT 18 12/02/2014 1148   ALT 9 03/07/2014 0915   BILITOT 0.45 12/02/2014 1148   BILITOT 0.5 03/07/2014 0915          Impression and Plan: Ms. Morgan Palmer is 49 year old Afro-American female with stage IIA nodular sclerosing Hodgkin's disease. It has been almost 7 years since she completed treatment. She had 4 cycles of ABVD followed by radiation. This was all completed back in May of 2009.  I don't see any evidence of recurrent disease. I do not. see any issues with respect to hypothyroidism. We are checking this whenever we see her. Her TSH today was 1.42.   We will plan to get her back in one year  Again, we are to make sure that she gets a mammogram.  Volanda Napoleon, MD 3/14/20165:36 PM

## 2014-12-03 ENCOUNTER — Other Ambulatory Visit: Payer: Self-pay | Admitting: Hematology & Oncology

## 2014-12-03 DIAGNOSIS — E669 Obesity, unspecified: Secondary | ICD-10-CM

## 2014-12-03 DIAGNOSIS — C8118 Nodular sclerosis classical Hodgkin lymphoma, lymph nodes of multiple sites: Secondary | ICD-10-CM

## 2014-12-03 DIAGNOSIS — Z1231 Encounter for screening mammogram for malignant neoplasm of breast: Secondary | ICD-10-CM

## 2014-12-03 LAB — VITAMIN D 25 HYDROXY (VIT D DEFICIENCY, FRACTURES): Vit D, 25-Hydroxy: 19 ng/mL — ABNORMAL LOW (ref 30–100)

## 2014-12-04 ENCOUNTER — Other Ambulatory Visit: Payer: Self-pay | Admitting: Nurse Practitioner

## 2014-12-04 ENCOUNTER — Telehealth: Payer: Self-pay | Admitting: Nurse Practitioner

## 2014-12-04 MED ORDER — ERGOCALCIFEROL 1.25 MG (50000 UT) PO CAPS: 50000.0000 [IU] | ORAL_CAPSULE | ORAL | Status: AC

## 2014-12-04 NOTE — Telephone Encounter (Addendum)
Pt verbalized understanding and script sent to requested pharmacy.   ----- Message from Volanda Napoleon, MD sent at 12/04/2014  7:06 AM EDT ----- Call - vit D is very low!!  She needs 50000 units a week of Vit D. Please call this in for her!!  Morgan Palmer

## 2014-12-17 ENCOUNTER — Ambulatory Visit: Payer: No Typology Code available for payment source

## 2014-12-24 ENCOUNTER — Encounter (INDEPENDENT_AMBULATORY_CARE_PROVIDER_SITE_OTHER): Payer: Self-pay

## 2014-12-24 ENCOUNTER — Ambulatory Visit
Admission: RE | Admit: 2014-12-24 | Discharge: 2014-12-24 | Disposition: A | Discharge status: Home / Self Care | Payer: No Typology Code available for payment source | Source: Ambulatory Visit | Attending: Hematology & Oncology | Admitting: Hematology & Oncology

## 2014-12-24 DIAGNOSIS — E669 Obesity, unspecified: Secondary | ICD-10-CM

## 2014-12-24 DIAGNOSIS — Z1231 Encounter for screening mammogram for malignant neoplasm of breast: Secondary | ICD-10-CM

## 2014-12-24 DIAGNOSIS — C8118 Nodular sclerosis classical Hodgkin lymphoma, lymph nodes of multiple sites: Secondary | ICD-10-CM

## 2015-02-03 ENCOUNTER — Encounter: Payer: Self-pay | Admitting: Internal Medicine

## 2015-02-06 ENCOUNTER — Encounter: Payer: Self-pay | Admitting: Internal Medicine

## 2015-12-01 ENCOUNTER — Ambulatory Visit: Payer: No Typology Code available for payment source | Admitting: Hematology & Oncology

## 2015-12-01 ENCOUNTER — Other Ambulatory Visit: Payer: No Typology Code available for payment source

## 2015-12-12 ENCOUNTER — Other Ambulatory Visit: Payer: Self-pay | Admitting: *Deleted

## 2015-12-12 DIAGNOSIS — C8118 Nodular sclerosis classical Hodgkin lymphoma, lymph nodes of multiple sites: Secondary | ICD-10-CM

## 2015-12-15 ENCOUNTER — Ambulatory Visit: Payer: No Typology Code available for payment source | Admitting: Hematology & Oncology

## 2015-12-15 ENCOUNTER — Other Ambulatory Visit: Payer: No Typology Code available for payment source

## 2016-01-26 ENCOUNTER — Encounter: Payer: Self-pay | Admitting: Hematology & Oncology

## 2016-01-26 ENCOUNTER — Ambulatory Visit (HOSPITAL_BASED_OUTPATIENT_CLINIC_OR_DEPARTMENT_OTHER): Payer: BLUE CROSS/BLUE SHIELD | Admitting: Hematology & Oncology

## 2016-01-26 ENCOUNTER — Other Ambulatory Visit (HOSPITAL_BASED_OUTPATIENT_CLINIC_OR_DEPARTMENT_OTHER): Payer: BLUE CROSS/BLUE SHIELD

## 2016-01-26 VITALS — BP 178/96 | HR 86 | Temp 97.8°F | Resp 18 | Ht 66.0 in | Wt 312.0 lb

## 2016-01-26 DIAGNOSIS — C8118 Nodular sclerosis classical Hodgkin lymphoma, lymph nodes of multiple sites: Secondary | ICD-10-CM

## 2016-01-26 DIAGNOSIS — M818 Other osteoporosis without current pathological fracture: Secondary | ICD-10-CM

## 2016-01-26 DIAGNOSIS — E038 Other specified hypothyroidism: Secondary | ICD-10-CM

## 2016-01-26 DIAGNOSIS — E559 Vitamin D deficiency, unspecified: Secondary | ICD-10-CM

## 2016-01-26 DIAGNOSIS — T387X5A Adverse effect of androgens and anabolic congeners, initial encounter: Secondary | ICD-10-CM

## 2016-01-26 DIAGNOSIS — C8119 Nodular sclerosis classical Hodgkin lymphoma, extranodal and solid organ sites: Secondary | ICD-10-CM

## 2016-01-26 DIAGNOSIS — E039 Hypothyroidism, unspecified: Secondary | ICD-10-CM

## 2016-01-26 DIAGNOSIS — I10 Essential (primary) hypertension: Secondary | ICD-10-CM

## 2016-01-26 LAB — CBC WITH DIFFERENTIAL (CANCER CENTER ONLY)
BASO#: 0 10*3/uL (ref 0.0–0.2)
BASO%: 0.3 % (ref 0.0–2.0)
EOS ABS: 0.3 10*3/uL (ref 0.0–0.5)
EOS%: 4.2 % (ref 0.0–7.0)
HEMATOCRIT: 36.4 % (ref 34.8–46.6)
HGB: 12.5 g/dL (ref 11.6–15.9)
LYMPH#: 1.7 10*3/uL (ref 0.9–3.3)
LYMPH%: 25.6 % (ref 14.0–48.0)
MCH: 29.1 pg (ref 26.0–34.0)
MCHC: 34.3 g/dL (ref 32.0–36.0)
MCV: 85 fL (ref 81–101)
MONO#: 0.5 10*3/uL (ref 0.1–0.9)
MONO%: 7.5 % (ref 0.0–13.0)
NEUT#: 4 10*3/uL (ref 1.5–6.5)
NEUT%: 62.4 % (ref 39.6–80.0)
Platelets: 306 10*3/uL (ref 145–400)
RBC: 4.3 10*6/uL (ref 3.70–5.32)
RDW: 13.5 % (ref 11.1–15.7)
WBC: 6.4 10*3/uL (ref 3.9–10.0)

## 2016-01-26 LAB — COMPREHENSIVE METABOLIC PANEL
ALK PHOS: 82 U/L (ref 40–150)
ALT: 11 U/L (ref 0–55)
ANION GAP: 8 meq/L (ref 3–11)
AST: 10 U/L (ref 5–34)
Albumin: 3.7 g/dL (ref 3.5–5.0)
BUN: 14.1 mg/dL (ref 7.0–26.0)
CALCIUM: 9.3 mg/dL (ref 8.4–10.4)
CO2: 22 mEq/L (ref 22–29)
Chloride: 111 mEq/L — ABNORMAL HIGH (ref 98–109)
Creatinine: 0.7 mg/dL (ref 0.6–1.1)
EGFR: >90 mL/min/{1.73_m2} (ref >90)
Glucose: 80 mg/dl (ref 70–140)
POTASSIUM: 3.9 meq/L (ref 3.5–5.1)
Sodium: 141 mEq/L (ref 136–145)
Total Bilirubin: 0.4 mg/dL (ref 0.20–1.20)
Total Protein: 7.2 g/dL (ref 6.4–8.3)

## 2016-01-26 LAB — TSH: TSH: 1.874 m[IU]/L (ref 0.308–3.960)

## 2016-01-26 MED ORDER — TRIAMTERENE-HCTZ 37.5-25 MG PO TABS: 1.0000 | ORAL_TABLET | Freq: Every day | ORAL | Status: DC
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
## 2016-01-27 ENCOUNTER — Telehealth: Payer: Self-pay | Admitting: *Deleted

## 2016-01-27 LAB — VITAMIN D 25 HYDROXY (VIT D DEFICIENCY, FRACTURES): Vitamin D, 25-Hydroxy: 20.5 ng/mL — ABNORMAL LOW (ref 30.0–100.0)

## 2016-01-27 NOTE — Telephone Encounter (Addendum)
Patient aware of results. She will purchase vitamin D supplements and start taking 2000 units a day.   ----- Message from Volanda Napoleon, MD sent at 01/27/2016  6:53 AM EDT ----- Call - vit D is very low.  She MUST be taking 2000 units a day!!  pete

## 2016-03-15 ENCOUNTER — Other Ambulatory Visit: Payer: Self-pay | Admitting: *Deleted

## 2016-03-15 MED ORDER — LANSOPRAZOLE 15 MG PO CPDR: 15.0000 mg | DELAYED_RELEASE_CAPSULE | Freq: Every day | ORAL | Status: AC

## 2016-10-20 ENCOUNTER — Other Ambulatory Visit: Payer: Self-pay | Admitting: Hematology & Oncology

## 2016-10-20 DIAGNOSIS — E559 Vitamin D deficiency, unspecified: Secondary | ICD-10-CM

## 2016-10-20 DIAGNOSIS — T387X5A Adverse effect of androgens and anabolic congeners, initial encounter: Secondary | ICD-10-CM

## 2016-10-20 DIAGNOSIS — M818 Other osteoporosis without current pathological fracture: Secondary | ICD-10-CM

## 2016-10-20 DIAGNOSIS — C8118 Nodular sclerosis classical Hodgkin lymphoma, lymph nodes of multiple sites: Secondary | ICD-10-CM

## 2016-10-20 DIAGNOSIS — E038 Other specified hypothyroidism: Secondary | ICD-10-CM

## 2016-10-20 DIAGNOSIS — I10 Essential (primary) hypertension: Secondary | ICD-10-CM

## 2017-05-03 ENCOUNTER — Ambulatory Visit: Payer: Self-pay | Admitting: Family Medicine

## 2017-12-03 ENCOUNTER — Other Ambulatory Visit: Payer: Self-pay

## 2017-12-03 ENCOUNTER — Encounter (HOSPITAL_COMMUNITY): Payer: Self-pay | Admitting: Emergency Medicine

## 2017-12-03 ENCOUNTER — Ambulatory Visit (INDEPENDENT_AMBULATORY_CARE_PROVIDER_SITE_OTHER): Payer: BLUE CROSS/BLUE SHIELD

## 2017-12-03 ENCOUNTER — Ambulatory Visit (HOSPITAL_COMMUNITY)
Admission: EM | Admit: 2017-12-03 | Discharge: 2017-12-03 | Disposition: A | Discharge status: Home / Self Care | Payer: BLUE CROSS/BLUE SHIELD | Attending: Family Medicine | Admitting: Family Medicine

## 2017-12-03 DIAGNOSIS — M25561 Pain in right knee: Secondary | ICD-10-CM

## 2017-12-03 DIAGNOSIS — M13862 Other specified arthritis, left knee: Secondary | ICD-10-CM | POA: Diagnosis not present

## 2017-12-03 DIAGNOSIS — A084 Viral intestinal infection, unspecified: Secondary | ICD-10-CM

## 2017-12-03 MED ORDER — METHYLPREDNISOLONE SODIUM SUCC 125 MG IJ SOLR
INTRAMUSCULAR | Status: AC
  Filled 2017-12-03: qty 2

## 2017-12-03 MED ORDER — METHYLPREDNISOLONE SODIUM SUCC 125 MG IJ SOLR
80.0000 mg | Freq: Once | INTRAMUSCULAR | Status: AC
  Administered 2017-12-03: 80 mg via INTRAMUSCULAR

## 2017-12-03 MED ORDER — BUPIVACAINE-EPINEPHRINE (PF) 0.5% -1:200000 IJ SOLN
INTRAMUSCULAR | Status: AC
  Filled 2017-12-03: qty 1.8

## 2017-12-03 MED ORDER — METHYLPREDNISOLONE ACETATE 80 MG/ML IJ SUSP
INTRAMUSCULAR | Status: AC
  Filled 2017-12-03: qty 1

## 2017-12-03 MED ORDER — BUPIVACAINE HCL (PF) 0.5 % IJ SOLN
INTRAMUSCULAR | Status: AC
  Filled 2017-12-03: qty 10

## 2017-12-03 NOTE — ED Triage Notes (Signed)
Vomiting and diarrhea started last night.  Needs a note to return to work.  No vomiting today.  Reports 1 episode of diarrhea today.    Right knee with chronic issues.  Patient has an appt with orthopedic provider.  Requesting cortisone injection today for pain

## 2017-12-03 NOTE — ED Provider Notes (Signed)
[place]   [id number] 12/03/17 Arrival Time: 85   SUBJECTIVE:  Morgan Palmer is a 52 y.o. female who presents to the urgent care with complaint of Vomiting and diarrhea started last night.  Needs a note to return to work.  No vomiting today.  Reports 1 episode of diarrhea today.    Right knee with chronic issues for years.  Patient has an appt with orthopedic provider.  Requesting cortisone injection today for pain   The knee gave out a couple days ago.    Patient works as a Quarry manager  Past Medical History:  Diagnosis Date  . Arthritis   . Cancer ([place])    Non Hodgkins Lymphoma  . Chronic leg pain    left   Family History  Problem Relation Age of Onset  . Hypertension Mother   . Diabetes Mother   . Deep vein thrombosis Mother   . Hypertension Father   . Hypertension Brother   . Cancer Maternal Aunt        breast  . Hypertension Maternal Grandmother   . Cancer Maternal Grandfather        prostate  . Cancer Paternal Grandmother        breast   Social History   Socioeconomic History  . Marital status: Single    Spouse name: Not on file  . Number of children: Not on file  . Years of education: Not on file  . Highest education level: Not on file  Social Needs  . Financial resource strain: Not on file  . Food insecurity - worry: Not on file  . Food insecurity - inability: Not on file  . Transportation needs - medical: Not on file  . Transportation needs - non-medical: Not on file  Occupational History  . Not on file  Tobacco Use  . Smoking status: Never Smoker  . Smokeless tobacco: Never Used  . Tobacco comment: never used tobacco  Substance and Sexual Activity  . Alcohol use: Yes    Alcohol/week: 0.0 oz  . Drug use: No  . Sexual activity: No  Other Topics Concern  . Not on file  Social History Narrative  . Not on file   No outpatient medications have been marked as taking for the 12/03/17 encounter [facility] Encounter).   No Known  Allergies    ROS: As per HPI, remainder of ROS negative.   OBJECTIVE:   Vitals:   12/03/17 1535  BP: (!) 156/100  Pulse: 94  Resp: (!) 22  Temp: 98.1 F (36.7 C)  TempSrc: Oral  SpO2: 98%     General appearance: alert; no distress; morbidly obese Eyes: PERRL; EOMI; conjunctiva normal HENT: normocephalic; atraumatic; TMs normal, canal normal, external ears normal without trauma; nasal mucosa normal; oral mucosa normal Neck: supple Lungs: clear to auscultation bilaterally Heart: regular rate and rhythm Abdomen: soft, non-tender; bowel sounds normal; no masses or organomegaly; no guarding or rebound tenderness Back: no CVA tenderness Extremities: no cyanosis or edema; symmetrical with no gross deformities; Right knee:  diffusely swollen with thick synovium and no definite effusion.  Good ROM with no localized tenderness  Skin: warm and dry Neurologic: normal gait; grossly normal Psychological: alert and cooperative; normal mood and affect      Labs:  Results for orders placed or performed in visit on 01/26/16  CBC w/Diff  Result Value Ref Range   WBC 6.4 3.9 - 10.0 10e3/uL   RBC 4.30 3.70 - 5.32 10e6/uL   HGB 12.5  11.6 - 15.9 g/dL   HCT 36.4 34.8 - 46.6 %   MCV 85 81 - 101 fL   MCH 29.1 26.0 - 34.0 pg   MCHC 34.3 32.0 - 36.0 g/dL   RDW 13.5 11.1 - 15.7 %   Platelets 306 145 - 400 10e3/uL   NEUT# 4.0 1.5 - 6.5 10e3/uL   LYMPH# 1.7 0.9 - 3.3 10e3/uL   MONO# 0.5 0.1 - 0.9 10e3/uL   Eosinophils Absolute 0.3 0.0 - 0.5 10e3/uL   BASO# 0.0 0.0 - 0.2 10e3/uL   NEUT% 62.4 39.6 - 80.0 %   LYMPH% 25.6 14.0 - 48.0 %   MONO% 7.5 0.0 - 13.0 %   EOS% 4.2 0.0 - 7.0 %   BASO% 0.3 0.0 - 2.0 %  CMP  Result Value Ref Range   Sodium 141 136 - 145 mEq/L   Potassium 3.9 3.5 - 5.1 mEq/L   Chloride 111 (H) 98 - 109 mEq/L   CO2 22 22 - 29 mEq/L   Glucose 80 70 - 140 mg/dl   BUN 14.1 7.0 - 26.0 mg/dL   Creatinine 0.7 0.6 - 1.1 mg/dL   Total Bilirubin 0.40 0.20 - 1.20 mg/dL    Alkaline Phosphatase 82 40 - 150 U/L   AST 10 5 - 34 U/L   ALT 11 0 - 55 U/L   Total Protein 7.2 6.4 - 8.3 g/dL   Albumin 3.7 3.5 - 5.0 g/dL   Calcium 9.3 8.4 - 10.4 mg/dL   Anion Gap 8 3 - 11 mEq/L   EGFR >90 >90 ml/min/1.73 m2  Vitamin D 25 hydroxy  Result Value Ref Range   Vitamin D, 25-Hydroxy 20.5 (L) 30.0 - 100.0 ng/mL  TSH  Result Value Ref Range   TSH 1.874 0.308 - 3.960 m(IU)/L    Labs Reviewed - No data to display  No results found.     ASSESSMENT & PLAN:  1. Allergic arthritis of left knee     No orders of the defined types were placed in this encounter.   Reviewed expectations re: course of current medical issues. Questions answered. Outlined signs and symptoms indicating need for more acute intervention. Patient verbalized understanding. After Visit Summary given.    Procedures: 80 mg of Depo-Medrol and 1 cc of Marcaine 0.5% was injected into the right knee, lateral approach, with no complications.      Robyn Haber, MD 12/03/17 1654

## 2017-12-03 NOTE — Discharge Instructions (Signed)
Keep appointment with the orthopedist

## 2018-01-06 ENCOUNTER — Other Ambulatory Visit: Payer: Self-pay | Admitting: Hematology & Oncology

## 2018-01-06 DIAGNOSIS — T387X5A Adverse effect of androgens and anabolic congeners, initial encounter: Secondary | ICD-10-CM

## 2018-01-06 DIAGNOSIS — C8118 Nodular sclerosis classical Hodgkin lymphoma, lymph nodes of multiple sites: Secondary | ICD-10-CM

## 2018-01-06 DIAGNOSIS — E038 Other specified hypothyroidism: Secondary | ICD-10-CM

## 2018-01-06 DIAGNOSIS — M818 Other osteoporosis without current pathological fracture: Secondary | ICD-10-CM

## 2018-01-06 DIAGNOSIS — E559 Vitamin D deficiency, unspecified: Secondary | ICD-10-CM

## 2018-01-06 DIAGNOSIS — I10 Essential (primary) hypertension: Secondary | ICD-10-CM

## 2018-04-04 ENCOUNTER — Other Ambulatory Visit: Payer: Self-pay | Admitting: Hematology & Oncology

## 2018-04-04 DIAGNOSIS — T387X5A Adverse effect of androgens and anabolic congeners, initial encounter: Secondary | ICD-10-CM

## 2018-04-04 DIAGNOSIS — C8118 Nodular sclerosis classical Hodgkin lymphoma, lymph nodes of multiple sites: Secondary | ICD-10-CM

## 2018-04-04 DIAGNOSIS — M818 Other osteoporosis without current pathological fracture: Secondary | ICD-10-CM

## 2018-04-04 DIAGNOSIS — E038 Other specified hypothyroidism: Secondary | ICD-10-CM

## 2018-04-04 DIAGNOSIS — I10 Essential (primary) hypertension: Secondary | ICD-10-CM

## 2018-04-04 DIAGNOSIS — E559 Vitamin D deficiency, unspecified: Secondary | ICD-10-CM

## 2018-05-02 ENCOUNTER — Ambulatory Visit (HOSPITAL_COMMUNITY)
Admission: EM | Admit: 2018-05-02 | Discharge: 2018-05-02 | Disposition: A | Discharge status: Home / Self Care | Payer: BLUE CROSS/BLUE SHIELD | Attending: Family Medicine | Admitting: Family Medicine

## 2018-05-02 ENCOUNTER — Other Ambulatory Visit: Payer: Self-pay

## 2018-05-02 ENCOUNTER — Encounter (HOSPITAL_COMMUNITY): Payer: Self-pay | Admitting: Emergency Medicine

## 2018-05-02 DIAGNOSIS — B9789 Other viral agents as the cause of diseases classified elsewhere: Secondary | ICD-10-CM

## 2018-05-02 DIAGNOSIS — J069 Acute upper respiratory infection, unspecified: Secondary | ICD-10-CM | POA: Diagnosis not present

## 2018-05-02 HISTORY — DX: Essential (primary) hypertension: I10

## 2018-05-02 MED ORDER — CETIRIZINE HCL 5 MG PO TABS: 5.0000 mg | ORAL_TABLET | Freq: Every day | ORAL | 1 refills | Status: AC

## 2018-05-02 MED ORDER — FLUTICASONE PROPIONATE 50 MCG/ACT NA SUSP: 1.0000 | Freq: Every day | NASAL | 2 refills | Status: AC

## 2018-05-02 MED ORDER — DM-GUAIFENESIN ER 30-600 MG PO TB12: 1.0000 | ORAL_TABLET | Freq: Two times a day (BID) | ORAL | 0 refills | Status: AC

## 2018-05-02 NOTE — ED Provider Notes (Addendum)
[place]    CSN: [id number] Arrival date & time: 05/02/18  0831     History   Chief Complaint Chief Complaint  Patient presents with  . URI    HPI GRIER CZERWINSKI is a 52 y.o. female.   Patient is a 52 year old female with past medical history of high blood pressure.  She presents with 4 days of upper respiratory symptoms to include cough, congestion, chills.  She has also had some bilateral eye redness with minimal congestion.  No eye pain, blurred vision or trouble seeing.  She denies any ear pain, sore throat, fevers.  She contributes her symptoms to starting after being at work on Friday and they are doing some painting. The symptoms have gotten worse. 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    She denies any history of allergies or asthma.   She does not smoke.   ROS per HPI      Past Medical History:  Diagnosis Date  . Arthritis   . Cancer ([place])    Non Hodgkins Lymphoma  . Chronic leg pain    left  . Hypertension     Patient Active Problem List   Diagnosis Date Noted  . Left hamstring muscle strain 06/12/2014  . 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 Situational stress 12/19/2013  . Morbid obesity ([place]) 05/15/2013  . Vitamin D deficiency 04/14/2013  . ACUTE BRONCHITIS 01/27/2010  . 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 ANEMIA 06/22/2007  . ALLERGIC RHINITIS 06/22/2007  . HYPERLIPIDEMIA 02/04/2007  . Obesity 02/04/2007  . Essential hypertension 02/04/2007  . GERD 02/04/2007  . OSTEOARTHRITIS 02/04/2007    Past Surgical History:  Procedure Laterality Date  . BREAST SURGERY     biopsy  . LYMPH NODE BIOPSY      OB History    Gravida  3   Para  2   Term      Preterm      AB  1   Living  2     SAB  1   TAB      Ectopic      Multiple      Live Births               Home Medications    Prior to Admission medications     Medication Sig Start Date End Date Taking? Authorizing Provider  ergocalciferol (VITAMIN D2) 50000 UNITS capsule Take 1 capsule (50,000 Units total) by mouth once a week. 12/04/14  Yes Volanda Napoleon, MD  Multiple Vitamins-Minerals (MULTIPLE VITAMINS/WOMENS PO) Take by mouth 2 (two) times daily.   Yes [provider]  triamterene-hydrochlorothiazide (MAXZIDE-25) 37.5-25 MG tablet TAKE 1 TABLET BY MOUTH DAILY 04/04/18  Yes Ennever, Rudell Cobb, MD  cetirizine (ZYRTEC) 5 MG tablet Take 1 tablet (5 mg total) by mouth daily. 05/02/18   Tatsuya Okray, Tressia Miners A, NP  dextromethorphan-guaiFENesin (MUCINEX DM) 30-600 MG 12hr tablet Take 1 tablet by mouth 2 (two) times daily. 05/02/18   Marsean Elkhatib, Tressia Miners A, NP  fluticasone (FLONASE) 50 MCG/ACT nasal spray Place 1 spray into both nostrils daily. 05/02/18   Loura Halt A, NP  ibuprofen (ADVIL,MOTRIN) 800 MG tablet One po bid for 5 days    Schoenhoff, Altamese Cabal, MD  lansoprazole (PREVACID) 15 MG capsule Take 1 capsule (15 mg total) by mouth daily. 03/15/16   Volanda Napoleon, MD  triamterene-hydrochlorothiazide (MAXZIDE-25) 37.5-25 MG tablet TAKE 1  TABLET BY MOUTH DAILY 10/20/16   Volanda Napoleon, MD    Family History Family History  Problem Relation Age of Onset  . Hypertension Mother   . Diabetes Mother   . Deep vein thrombosis Mother   . Hypertension Father   . Hypertension Brother   . Cancer Maternal Aunt        breast  . Hypertension Maternal Grandmother   . Cancer Maternal Grandfather        prostate  . Cancer Paternal Grandmother        breast    Social History Social History   Tobacco Use  . Smoking status: Never Smoker  . Smokeless tobacco: Never Used  . Tobacco comment: never used tobacco  Substance Use Topics  . Alcohol use: Yes    Alcohol/week: 0.0 standard drinks  . Drug use: No     Allergies   Patient has no known allergies.   Review of Systems Review of Systems   Physical Exam Triage Vital Signs ED Triage Vitals  Enc Vitals  Group     BP 05/02/18 0844 (!) 161/107     Pulse Rate 05/02/18 0844 (!) 117     Resp 05/02/18 0844 20     Temp 05/02/18 0844 98.4 F (36.9 C)     Temp Source 05/02/18 0844 Oral     SpO2 05/02/18 0844 95 %     Weight --      Height --      Head Circumference --      Peak Flow --      Pain Score 05/02/18 0900 7     Pain Loc --      Pain Edu? --      Excl. in [place]? --    No data found.  Updated Vital Signs BP (!) 161/107 (BP Location: Right Arm)   Pulse (!) 117   Temp 98.4 F (36.9 C) (Oral)   Resp 20   SpO2 95%   Visual Acuity Right Eye Distance:   Left Eye Distance:   Bilateral Distance:    Right Eye Near:   Left Eye Near:    Bilateral Near:     Physical Exam  Constitutional: She is oriented to person, place, and time. She appears well-developed and well-nourished. No distress.  HENT:  Head: Normocephalic and atraumatic.  Right Ear: External ear normal.  Left Ear: External ear normal.  Nose: Nose normal.  Mouth/Throat: Oropharynx is clear and moist. No oropharyngeal exudate.  1+ tonsillar swelling without exudates.  Slight posterior oropharyngeal erythema. Bilateral TMs normal. bilateral nasal turbinate swelling. No sinus tenderness.   Eyes: Left conjunctiva is injected.  Mild crusting  to the left eye.   Neck: Normal range of motion.  No adenopathy  Cardiovascular: Normal rate and regular rhythm.  Pulmonary/Chest:  No adventitious breath sounds  Neurological: She is alert and oriented to person, place, and time.  Skin: Skin is warm and dry.  Psychiatric: She has a normal mood and affect.  Nursing note and vitals reviewed.    UC Treatments / Results  Labs (all labs ordered are listed, but only abnormal results are displayed) Labs Reviewed - No data to display  EKG None  Radiology No results found.  Procedures Procedures (including critical care time)  Medications Ordered in UC Medications - No data to display  Initial Impression / Assessment  and Plan / UC Course  I have reviewed the triage vital signs and the nursing notes.  Pertinent labs &  imaging results that were available during my care of the patient were reviewed by me and considered in my medical decision making (see chart for details).     Most likely viral URI. Pt non toxic or ill appearing.   Symptomatic treatment.  Follow up as needed.  Final Clinical Impressions(s) / UC Diagnoses   Final diagnoses:  Viral URI with cough     Discharge Instructions     It was nice meeting you!!  I believe this is a viral URI.  We will treat the symptoms.  Work note given.  Follow up if not improving.     ED Prescriptions    Medication Sig Dispense Auth. Provider   dextromethorphan-guaiFENesin (MUCINEX DM) 30-600 MG 12hr tablet Take 1 tablet by mouth 2 (two) times daily. 30 tablet Shaleigh Laubscher A, NP   fluticasone (FLONASE) 50 MCG/ACT nasal spray Place 1 spray into both nostrils daily. 16 g Braylea Brancato A, NP   cetirizine (ZYRTEC) 5 MG tablet Take 1 tablet (5 mg total) by mouth daily. 30 tablet Loura Halt A, NP     Controlled Substance Prescriptions [place] Controlled Substance Registry consulted? Not Applicable             Orvan July, NP 05/02/18 1024

## 2018-05-02 NOTE — ED Triage Notes (Signed)
Onset of symptoms on Thursday and worsened over the weekend.  Watery eyes, coughing, left eye red.  Patient normally wears contact lenses.  Throat hurts to swallow.    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

## 2018-05-02 NOTE — Discharge Instructions (Addendum)
It was nice meeting you!!  I believe this is a viral URI.  We will treat the symptoms.  Work note given.  Follow up if not improving.

## 2018-06-13 ENCOUNTER — Ambulatory Visit (INDEPENDENT_AMBULATORY_CARE_PROVIDER_SITE_OTHER): Payer: Self-pay | Admitting: Family Medicine

## 2018-07-04 ENCOUNTER — Other Ambulatory Visit: Payer: Self-pay | Admitting: Hematology & Oncology

## 2018-07-04 DIAGNOSIS — M818 Other osteoporosis without current pathological fracture: Secondary | ICD-10-CM

## 2018-07-04 DIAGNOSIS — T387X5A Adverse effect of androgens and anabolic congeners, initial encounter: Secondary | ICD-10-CM

## 2018-07-04 DIAGNOSIS — I10 Essential (primary) hypertension: Secondary | ICD-10-CM

## 2018-07-04 DIAGNOSIS — E559 Vitamin D deficiency, unspecified: Secondary | ICD-10-CM

## 2018-07-04 DIAGNOSIS — C8118 Nodular sclerosis classical Hodgkin lymphoma, lymph nodes of multiple sites: Secondary | ICD-10-CM

## 2018-07-04 DIAGNOSIS — E038 Other specified hypothyroidism: Secondary | ICD-10-CM

## 2018-07-14 ENCOUNTER — Other Ambulatory Visit: Payer: Self-pay | Admitting: Hematology & Oncology

## 2018-07-14 DIAGNOSIS — T387X5A Adverse effect of androgens and anabolic congeners, initial encounter: Secondary | ICD-10-CM

## 2018-07-14 DIAGNOSIS — E559 Vitamin D deficiency, unspecified: Secondary | ICD-10-CM

## 2018-07-14 DIAGNOSIS — I10 Essential (primary) hypertension: Secondary | ICD-10-CM

## 2018-07-14 DIAGNOSIS — E038 Other specified hypothyroidism: Secondary | ICD-10-CM

## 2018-07-14 DIAGNOSIS — C8118 Nodular sclerosis classical Hodgkin lymphoma, lymph nodes of multiple sites: Secondary | ICD-10-CM

## 2018-07-14 DIAGNOSIS — M818 Other osteoporosis without current pathological fracture: Secondary | ICD-10-CM

## 2019-04-18 ENCOUNTER — Other Ambulatory Visit: Payer: Self-pay | Admitting: Hematology & Oncology

## 2019-04-18 DIAGNOSIS — C8118 Nodular sclerosis classical Hodgkin lymphoma, lymph nodes of multiple sites: Secondary | ICD-10-CM

## 2019-04-18 DIAGNOSIS — E038 Other specified hypothyroidism: Secondary | ICD-10-CM

## 2019-04-18 DIAGNOSIS — I10 Essential (primary) hypertension: Secondary | ICD-10-CM

## 2019-04-18 DIAGNOSIS — M818 Other osteoporosis without current pathological fracture: Secondary | ICD-10-CM

## 2019-04-18 DIAGNOSIS — E559 Vitamin D deficiency, unspecified: Secondary | ICD-10-CM

## 2019-07-13 ENCOUNTER — Other Ambulatory Visit: Payer: Self-pay | Admitting: Hematology & Oncology

## 2019-07-13 DIAGNOSIS — I10 Essential (primary) hypertension: Secondary | ICD-10-CM

## 2019-07-13 DIAGNOSIS — M818 Other osteoporosis without current pathological fracture: Secondary | ICD-10-CM

## 2019-07-13 DIAGNOSIS — C8118 Nodular sclerosis classical Hodgkin lymphoma, lymph nodes of multiple sites: Secondary | ICD-10-CM

## 2019-07-13 DIAGNOSIS — E559 Vitamin D deficiency, unspecified: Secondary | ICD-10-CM

## 2019-07-13 DIAGNOSIS — E038 Other specified hypothyroidism: Secondary | ICD-10-CM

## 2019-10-12 ENCOUNTER — Other Ambulatory Visit: Payer: Self-pay | Admitting: Hematology & Oncology

## 2019-10-12 DIAGNOSIS — E559 Vitamin D deficiency, unspecified: Secondary | ICD-10-CM

## 2019-10-12 DIAGNOSIS — C8118 Nodular sclerosis classical Hodgkin lymphoma, lymph nodes of multiple sites: Secondary | ICD-10-CM

## 2019-10-12 DIAGNOSIS — T387X5A Adverse effect of androgens and anabolic congeners, initial encounter: Secondary | ICD-10-CM

## 2019-10-12 DIAGNOSIS — M818 Other osteoporosis without current pathological fracture: Secondary | ICD-10-CM

## 2019-10-12 DIAGNOSIS — I10 Essential (primary) hypertension: Secondary | ICD-10-CM

## 2019-10-12 DIAGNOSIS — E038 Other specified hypothyroidism: Secondary | ICD-10-CM

## 2020-01-19 ENCOUNTER — Other Ambulatory Visit: Payer: Self-pay | Admitting: Hematology & Oncology

## 2020-01-19 DIAGNOSIS — I10 Essential (primary) hypertension: Secondary | ICD-10-CM

## 2020-01-19 DIAGNOSIS — M818 Other osteoporosis without current pathological fracture: Secondary | ICD-10-CM

## 2020-01-19 DIAGNOSIS — E559 Vitamin D deficiency, unspecified: Secondary | ICD-10-CM

## 2020-01-19 DIAGNOSIS — C8118 Nodular sclerosis classical Hodgkin lymphoma, lymph nodes of multiple sites: Secondary | ICD-10-CM

## 2020-01-19 DIAGNOSIS — E038 Other specified hypothyroidism: Secondary | ICD-10-CM

## 2020-01-19 DIAGNOSIS — T387X5A Adverse effect of androgens and anabolic congeners, initial encounter: Secondary | ICD-10-CM

## 2020-04-08 ENCOUNTER — Ambulatory Visit (HOSPITAL_COMMUNITY)
Admission: EM | Admit: 2020-04-08 | Discharge: 2020-04-08 | Disposition: A | Discharge status: Home / Self Care | Payer: Medicaid Other | Attending: Family Medicine | Admitting: Family Medicine

## 2020-04-08 ENCOUNTER — Other Ambulatory Visit: Payer: Self-pay

## 2020-04-08 ENCOUNTER — Encounter (HOSPITAL_COMMUNITY): Payer: Self-pay

## 2020-04-08 DIAGNOSIS — R6884 Jaw pain: Secondary | ICD-10-CM | POA: Diagnosis not present

## 2020-04-08 DIAGNOSIS — J358 Other chronic diseases of tonsils and adenoids: Secondary | ICD-10-CM | POA: Insufficient documentation

## 2020-04-08 DIAGNOSIS — H9209 Otalgia, unspecified ear: Secondary | ICD-10-CM | POA: Diagnosis not present

## 2020-04-08 LAB — POCT RAPID STREP A: Streptococcus, Group A Screen (Direct): NEGATIVE

## 2020-04-08 NOTE — ED Provider Notes (Signed)
[place]    CSN: [id number] Arrival date & time: 04/08/20  0626      History   Chief Complaint Chief Complaint  Patient presents with  . Otalgia  . Jaw Pain    HPI Morgan Palmer is a 54 y.o. female.   Patient is a 54 year old female presents today with left ear, jaw, sore throat.  Symptoms have been constant since Saturday.  She was seen by her dentist yesterday where they did x-rays and did not find anything.  They gave her Tylenol 3 for pain.  Reporting not sleeping well due to the pain.  Denies any associated fever, chills, body aches.  Pain with swallowing.  No trouble swallowing or breathing.  ROS per HPI      Past Medical History:  Diagnosis Date  . Arthritis   . Cancer ([place])    Non Hodgkins Lymphoma  . Chronic leg pain    left  . Hypertension     Patient Active Problem List   Diagnosis Date Noted  . Left hamstring muscle strain 06/12/2014  . 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 Situational stress 12/19/2013  . Morbid obesity ([place]) 05/15/2013  . Vitamin D deficiency 04/14/2013  . ACUTE BRONCHITIS 01/27/2010  . 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 ANEMIA 06/22/2007  . ALLERGIC RHINITIS 06/22/2007  . HYPERLIPIDEMIA 02/04/2007  . Obesity 02/04/2007  . Essential hypertension 02/04/2007  . GERD 02/04/2007  . OSTEOARTHRITIS 02/04/2007    Past Surgical History:  Procedure Laterality Date  . BREAST SURGERY     biopsy  . LYMPH NODE BIOPSY      OB History    Gravida  3   Para  2   Term      Preterm      AB  1   Living  2     SAB  1   TAB      Ectopic      Multiple      Live Births               Home Medications    Prior to Admission medications   Medication Sig Start Date End Date Taking? Authorizing Provider  acetaminophen-codeine (TYLENOL #3) 300-30 MG tablet Take by mouth every 4 (four) hours as needed for moderate pain.   Yes [provider]   cetirizine (ZYRTEC) 5 MG tablet Take 1 tablet (5 mg total) by mouth daily. 05/02/18   Alzada Brazee, Tressia Miners A, NP  dextromethorphan-guaiFENesin (MUCINEX DM) 30-600 MG 12hr tablet Take 1 tablet by mouth 2 (two) times daily. 05/02/18   Loura Halt A, NP  ergocalciferol (VITAMIN D2) 50000 UNITS capsule Take 1 capsule (50,000 Units total) by mouth once a week. 12/04/14   Volanda Napoleon, MD  fluticasone (FLONASE) 50 MCG/ACT nasal spray Place 1 spray into both nostrils daily. 05/02/18   Loura Halt A, NP  ibuprofen (ADVIL,MOTRIN) 800 MG tablet One po bid for 5 days    Schoenhoff, Altamese Cabal, MD  lansoprazole (PREVACID) 15 MG capsule Take 1 capsule (15 mg total) by mouth daily. 03/15/16   Volanda Napoleon, MD  Multiple Vitamins-Minerals (MULTIPLE VITAMINS/WOMENS PO) Take by mouth 2 (two) times daily.    [provider]  triamterene-hydrochlorothiazide (MAXZIDE-25) 37.5-25 MG tablet TAKE 1 TABLET BY MOUTH DAILY 10/20/16   Volanda Napoleon, MD  triamterene-hydrochlorothiazide (RSWNIOE-70) 37.5-25 MG tablet TAKE 1 TABLET BY MOUTH DAILY 01/21/20   Volanda Napoleon, MD  Family History Family History  Problem Relation Age of Onset  . Hypertension Mother   . Diabetes Mother   . Deep vein thrombosis Mother   . Hypertension Father   . Hypertension Brother   . Cancer Maternal Aunt        breast  . Hypertension Maternal Grandmother   . Cancer Maternal Grandfather        prostate  . Cancer Paternal Grandmother        breast    Social History Social History   Tobacco Use  . Smoking status: Never Smoker  . Smokeless tobacco: Never Used  . Tobacco comment: never used tobacco  Substance Use Topics  . Alcohol use: Yes    Alcohol/week: 0.0 standard drinks  . Drug use: No     Allergies   Patient has no known allergies.   Review of Systems Review of Systems   Physical Exam Triage Vital Signs ED Triage Vitals  Enc Vitals Group     BP 04/08/20 0826 (!) 144/94     Pulse Rate 04/08/20 0826 90      Resp 04/08/20 0826 20     Temp 04/08/20 0826 98.4 F (36.9 C)     Temp src --      SpO2 04/08/20 0826 99 %     Weight --      Height --      Head Circumference --      Peak Flow --      Pain Score 04/08/20 0824 7     Pain Loc --      Pain Edu? --      Excl. in [place]? --    No data found.  Updated Vital Signs BP (!) 144/94 (BP Location: Left Wrist)   Pulse 90   Temp 98.4 F (36.9 C)   Resp 20   SpO2 99%   Visual Acuity Right Eye Distance:   Left Eye Distance:   Bilateral Distance:    Right Eye Near:   Left Eye Near:    Bilateral Near:     Physical Exam Vitals and nursing note reviewed.  Constitutional:      General: She is not in acute distress.    Appearance: Normal appearance. She is not ill-appearing, toxic-appearing or diaphoretic.  HENT:     Head: Normocephalic.     Nose: Nose normal.     Mouth/Throat:     Comments: 1+ left tonsillar swelling with tonsil stone Eyes:     Conjunctiva/sclera: Conjunctivae normal.  Pulmonary:     Effort: Pulmonary effort is normal.  Musculoskeletal:        General: Normal range of motion.     Cervical back: Normal range of motion.  Skin:    General: Skin is warm and dry.     Findings: No rash.  Neurological:     Mental Status: She is alert.  Psychiatric:        Mood and Affect: Mood normal.      UC Treatments / Results  Labs (all labs ordered are listed, but only abnormal results are displayed) Labs Reviewed  CULTURE, GROUP A STREP [facility])  POCT RAPID STREP A    EKG   Radiology No results found.  Procedures Procedures (including critical care time)  Medications Ordered in UC Medications - No data to display  Initial Impression / Assessment and Plan / UC Course  I have reviewed the triage vital signs and the nursing notes.  Pertinent labs & imaging results  that were available during my care of the patient were reviewed by me and considered in my medical decision making (see chart for details).      Tonsil stone Strep test negative We will have her do warm salt water gargles. Instructions on how to cleanse the tonsil Follow up as needed for continued or worsening symptoms  Final Clinical Impressions(s) / UC Diagnoses   Final diagnoses:  Tonsil stone     Discharge Instructions     Your strep test was negative Believe this is a tonsil stone. Try doing salt waster gargles a few times a day to clean the tonsils of debris You can try cleaning the tonsil with a water pick or swab.  Follow up as needed for continued or worsening symptoms     ED Prescriptions    None     PDMP not reviewed this encounter.   Orvan July, NP 04/08/20 [phone]

## 2020-04-08 NOTE — ED Triage Notes (Signed)
Pt reports left ear pain and left sided jaw pain x 2 days. Pt taking Tylenol 3, since yesterday after visiting the Dentist. Denies fever, chills.

## 2020-04-08 NOTE — Discharge Instructions (Addendum)
Your strep test was negative Believe this is a tonsil stone. Try doing salt waster gargles a few times a day to clean the tonsils of debris You can try cleaning the tonsil with a water pick or swab.  Follow up as needed for continued or worsening symptoms

## 2020-04-10 LAB — CULTURE, GROUP A STREP (THRC)

## 2020-04-29 ENCOUNTER — Other Ambulatory Visit: Payer: Self-pay | Admitting: Hematology & Oncology

## 2020-04-29 DIAGNOSIS — M818 Other osteoporosis without current pathological fracture: Secondary | ICD-10-CM

## 2020-04-29 DIAGNOSIS — E559 Vitamin D deficiency, unspecified: Secondary | ICD-10-CM

## 2020-04-29 DIAGNOSIS — I10 Essential (primary) hypertension: Secondary | ICD-10-CM

## 2020-04-29 DIAGNOSIS — C8118 Nodular sclerosis classical Hodgkin lymphoma, lymph nodes of multiple sites: Secondary | ICD-10-CM

## 2020-04-29 DIAGNOSIS — E038 Other specified hypothyroidism: Secondary | ICD-10-CM

## 2020-07-31 ENCOUNTER — Other Ambulatory Visit: Payer: Self-pay | Admitting: Hematology & Oncology

## 2020-07-31 DIAGNOSIS — E038 Other specified hypothyroidism: Secondary | ICD-10-CM

## 2020-07-31 DIAGNOSIS — E559 Vitamin D deficiency, unspecified: Secondary | ICD-10-CM

## 2020-07-31 DIAGNOSIS — M818 Other osteoporosis without current pathological fracture: Secondary | ICD-10-CM

## 2020-07-31 DIAGNOSIS — I10 Essential (primary) hypertension: Secondary | ICD-10-CM

## 2020-07-31 DIAGNOSIS — C8118 Nodular sclerosis classical Hodgkin lymphoma, lymph nodes of multiple sites: Secondary | ICD-10-CM

## 2020-10-28 ENCOUNTER — Other Ambulatory Visit: Payer: Self-pay | Admitting: Hematology & Oncology

## 2020-10-28 DIAGNOSIS — E038 Other specified hypothyroidism: Secondary | ICD-10-CM

## 2020-10-28 DIAGNOSIS — M818 Other osteoporosis without current pathological fracture: Secondary | ICD-10-CM

## 2020-10-28 DIAGNOSIS — I10 Essential (primary) hypertension: Secondary | ICD-10-CM

## 2020-10-28 DIAGNOSIS — C8118 Nodular sclerosis classical Hodgkin lymphoma, lymph nodes of multiple sites: Secondary | ICD-10-CM

## 2020-10-28 DIAGNOSIS — E559 Vitamin D deficiency, unspecified: Secondary | ICD-10-CM

## 2021-01-27 ENCOUNTER — Other Ambulatory Visit: Payer: Self-pay | Admitting: Hematology & Oncology

## 2021-01-27 DIAGNOSIS — E038 Other specified hypothyroidism: Secondary | ICD-10-CM

## 2021-01-27 DIAGNOSIS — E559 Vitamin D deficiency, unspecified: Secondary | ICD-10-CM

## 2021-01-27 DIAGNOSIS — M818 Other osteoporosis without current pathological fracture: Secondary | ICD-10-CM

## 2021-01-27 DIAGNOSIS — I10 Essential (primary) hypertension: Secondary | ICD-10-CM

## 2021-01-27 DIAGNOSIS — C8118 Nodular sclerosis classical Hodgkin lymphoma, lymph nodes of multiple sites: Secondary | ICD-10-CM

## 2021-10-02 ENCOUNTER — Other Ambulatory Visit: Payer: Self-pay | Admitting: Student

## 2021-10-02 ENCOUNTER — Ambulatory Visit
Admission: RE | Admit: 2021-10-02 | Discharge: 2021-10-02 | Disposition: A | Discharge status: Home / Self Care | Payer: Medicare Other | Source: Ambulatory Visit | Attending: Student | Admitting: Student

## 2021-10-02 DIAGNOSIS — R053 Chronic cough: Secondary | ICD-10-CM

## 2022-11-24 ENCOUNTER — Telehealth (HOSPITAL_BASED_OUTPATIENT_CLINIC_OR_DEPARTMENT_OTHER): Payer: Self-pay | Admitting: *Deleted

## 2022-11-24 NOTE — Telephone Encounter (Signed)
Left message for patient to call and discuss scheduling the Echocardiogram ordered by Waymond Cera, NP

## 2022-11-25 ENCOUNTER — Other Ambulatory Visit: Payer: Self-pay | Admitting: Student

## 2022-11-25 DIAGNOSIS — I10 Essential (primary) hypertension: Secondary | ICD-10-CM

## 2022-11-29 ENCOUNTER — Ambulatory Visit: Payer: 59

## 2022-11-29 DIAGNOSIS — I10 Essential (primary) hypertension: Secondary | ICD-10-CM

## 2023-12-07 ENCOUNTER — Encounter: Payer: Self-pay | Admitting: Podiatry

## 2023-12-07 ENCOUNTER — Ambulatory Visit (INDEPENDENT_AMBULATORY_CARE_PROVIDER_SITE_OTHER): Admitting: Podiatry

## 2023-12-07 DIAGNOSIS — B351 Tinea unguium: Secondary | ICD-10-CM

## 2023-12-07 DIAGNOSIS — M79674 Pain in right toe(s): Secondary | ICD-10-CM

## 2023-12-07 DIAGNOSIS — M79675 Pain in left toe(s): Secondary | ICD-10-CM | POA: Diagnosis not present

## 2023-12-07 DIAGNOSIS — L309 Dermatitis, unspecified: Secondary | ICD-10-CM

## 2023-12-07 NOTE — Progress Notes (Signed)
 Subjective:   Patient ID: Morgan Palmer, female   DOB: 58 y.o.   MRN: [id number]   HPI Patient presents with severely elongated nailbeds 1-5 both feet that she cannot take care of and dry skin bilateral.  States nailbeds are hard to wear shoe gear with comfortably and patient does not currently smoke he is obese and is not active   Review of Systems  All other systems reviewed and are negative.       Objective:  Physical Exam Vitals and nursing note reviewed.  Constitutional:      Appearance: She is well-developed.  Pulmonary:     Effort: Pulmonary effort is normal.  Musculoskeletal:        General: Normal range of motion.  Skin:    General: Skin is warm.  Neurological:     Mental Status: She is alert.     Neurovascular status found to be intact muscle strength is moderately reduced range of motion subtalar midtarsal joint reduced.  Severely elongated nailbeds 1-5 both feet thick with tissue formation painful when pressed from a dorsal direction with dry skin generalized     Assessment:  Mycotic nail infection with pain 1-5 both feet and also generalized skin issue     Plan:  HD P reviewed and at this point went ahead debrided nailbeds 1-5 both feet iatrogenic bleeding reappoint routine care
